# Patient Record
Sex: Male | Born: 1955 | Race: White | Hispanic: No | Marital: Married
Health system: Midwestern US, Academic
[De-identification: ages and names within clinical notes are randomized; demographics above are authoritative.]

---

## 2018-03-20 LAB — LIPID PROFILE
Lab: 156
Lab: 96

## 2018-03-20 LAB — COMPREHENSIVE METABOLIC PANEL: Lab: 142

## 2018-03-25 LAB — THYROID STIMULATING HORMONE-TSH: Lab: 1

## 2018-03-25 LAB — PROSTATIC SPECIFIC ANTIGEN-PSA

## 2018-04-02 ENCOUNTER — Encounter: Admit: 2018-04-02 | Discharge: 2018-04-02 | Payer: BC Managed Care – PPO

## 2018-04-02 DIAGNOSIS — E785 Hyperlipidemia, unspecified: ICD-10-CM

## 2018-04-02 DIAGNOSIS — I10 Essential (primary) hypertension: ICD-10-CM

## 2018-04-02 DIAGNOSIS — R970 Elevated carcinoembryonic antigen [CEA]: Principal | ICD-10-CM

## 2018-04-16 ENCOUNTER — Encounter: Admit: 2018-04-16 | Discharge: 2018-04-16 | Payer: BC Managed Care – PPO

## 2018-04-17 ENCOUNTER — Encounter: Admit: 2018-04-17 | Discharge: 2018-04-17 | Payer: BC Managed Care – PPO

## 2018-04-17 DIAGNOSIS — E669 Obesity, unspecified: ICD-10-CM

## 2018-04-17 DIAGNOSIS — I10 Essential (primary) hypertension: Principal | ICD-10-CM

## 2018-04-17 DIAGNOSIS — C61 Malignant neoplasm of prostate: ICD-10-CM

## 2018-04-17 DIAGNOSIS — N529 Male erectile dysfunction, unspecified: ICD-10-CM

## 2018-04-18 ENCOUNTER — Encounter: Admit: 2018-04-18 | Discharge: 2018-04-18 | Payer: BC Managed Care – PPO

## 2018-04-19 ENCOUNTER — Encounter: Admit: 2018-04-19 | Discharge: 2018-04-19 | Payer: BC Managed Care – PPO

## 2018-04-19 DIAGNOSIS — I10 Essential (primary) hypertension: Secondary | ICD-10-CM

## 2018-04-19 DIAGNOSIS — M549 Dorsalgia, unspecified: ICD-10-CM

## 2018-04-19 DIAGNOSIS — C61 Malignant neoplasm of prostate: ICD-10-CM

## 2018-04-19 DIAGNOSIS — N529 Male erectile dysfunction, unspecified: ICD-10-CM

## 2018-04-19 DIAGNOSIS — E669 Obesity, unspecified: ICD-10-CM

## 2018-04-19 DIAGNOSIS — E785 Hyperlipidemia, unspecified: ICD-10-CM

## 2018-04-25 ENCOUNTER — Ambulatory Visit: Admit: 2018-04-25 | Discharge: 2018-04-26 | Payer: BC Managed Care – PPO

## 2018-04-25 ENCOUNTER — Encounter: Admit: 2018-04-25 | Discharge: 2018-04-25 | Payer: BC Managed Care – PPO

## 2018-04-25 DIAGNOSIS — E785 Hyperlipidemia, unspecified: ICD-10-CM

## 2018-04-25 DIAGNOSIS — I10 Essential (primary) hypertension: ICD-10-CM

## 2018-04-25 DIAGNOSIS — N529 Male erectile dysfunction, unspecified: ICD-10-CM

## 2018-04-25 DIAGNOSIS — Z9189 Other specified personal risk factors, not elsewhere classified: ICD-10-CM

## 2018-04-25 DIAGNOSIS — M549 Dorsalgia, unspecified: ICD-10-CM

## 2018-04-25 DIAGNOSIS — E669 Obesity, unspecified: ICD-10-CM

## 2018-04-25 DIAGNOSIS — C61 Malignant neoplasm of prostate: ICD-10-CM

## 2018-04-25 DIAGNOSIS — E782 Mixed hyperlipidemia: ICD-10-CM

## 2018-04-25 DIAGNOSIS — R600 Localized edema: ICD-10-CM

## 2018-04-25 MED ORDER — METOPROLOL TARTRATE 25 MG PO TAB
25 mg | ORAL_TABLET | Freq: Two times a day (BID) | ORAL | 11 refills | 90.00000 days | Status: AC
Start: 2018-04-25 — End: 2018-05-30

## 2018-04-26 ENCOUNTER — Encounter: Admit: 2018-04-26 | Discharge: 2018-04-26 | Payer: BC Managed Care – PPO

## 2018-05-14 ENCOUNTER — Encounter: Admit: 2018-05-14 | Discharge: 2018-05-14 | Payer: BC Managed Care – PPO

## 2018-05-14 ENCOUNTER — Ambulatory Visit: Admit: 2018-05-14 | Discharge: 2018-05-15 | Payer: BC Managed Care – PPO

## 2018-05-14 ENCOUNTER — Ambulatory Visit: Admit: 2018-05-14 | Discharge: 2018-05-14 | Payer: BC Managed Care – PPO

## 2018-05-14 DIAGNOSIS — E785 Hyperlipidemia, unspecified: ICD-10-CM

## 2018-05-14 DIAGNOSIS — I10 Essential (primary) hypertension: Principal | ICD-10-CM

## 2018-05-14 DIAGNOSIS — R609 Edema, unspecified: ICD-10-CM

## 2018-05-14 DIAGNOSIS — I25119 Atherosclerotic heart disease of native coronary artery with unspecified angina pectoris: ICD-10-CM

## 2018-05-20 ENCOUNTER — Encounter: Admit: 2018-05-20 | Discharge: 2018-05-20 | Payer: BC Managed Care – PPO

## 2018-05-30 ENCOUNTER — Encounter: Admit: 2018-05-30 | Discharge: 2018-05-30 | Payer: BC Managed Care – PPO

## 2018-05-30 ENCOUNTER — Ambulatory Visit: Admit: 2018-05-30 | Discharge: 2018-05-31 | Payer: BC Managed Care – PPO

## 2018-05-30 DIAGNOSIS — R0600 Dyspnea, unspecified: Principal | ICD-10-CM

## 2018-05-30 DIAGNOSIS — R931 Abnormal findings on diagnostic imaging of heart and coronary circulation: ICD-10-CM

## 2018-05-30 DIAGNOSIS — I10 Essential (primary) hypertension: ICD-10-CM

## 2018-05-30 DIAGNOSIS — E785 Hyperlipidemia, unspecified: ICD-10-CM

## 2018-05-30 DIAGNOSIS — R9439 Abnormal result of other cardiovascular function study: ICD-10-CM

## 2018-05-30 DIAGNOSIS — N529 Male erectile dysfunction, unspecified: ICD-10-CM

## 2018-05-30 DIAGNOSIS — I5042 Chronic combined systolic (congestive) and diastolic (congestive) heart failure: ICD-10-CM

## 2018-05-30 DIAGNOSIS — E669 Obesity, unspecified: ICD-10-CM

## 2018-05-30 DIAGNOSIS — Z9189 Other specified personal risk factors, not elsewhere classified: ICD-10-CM

## 2018-05-30 DIAGNOSIS — M549 Dorsalgia, unspecified: ICD-10-CM

## 2018-05-30 DIAGNOSIS — C61 Malignant neoplasm of prostate: ICD-10-CM

## 2018-05-30 MED ORDER — METOPROLOL SUCCINATE 25 MG PO TB24
25 mg | ORAL_TABLET | Freq: Two times a day (BID) | ORAL | 3 refills | 90.00000 days | Status: AC
Start: 2018-05-30 — End: 2018-12-31

## 2018-05-30 MED ORDER — TEMAZEPAM 7.5 MG PO CAP
15 mg | Freq: Every evening | ORAL | 0 refills | Status: CN | PRN
Start: 2018-05-30 — End: ?

## 2018-05-30 MED ORDER — DOCUSATE SODIUM 100 MG PO CAP
100 mg | Freq: Every day | ORAL | 0 refills | Status: CN | PRN
Start: 2018-05-30 — End: ?

## 2018-05-30 MED ORDER — ASPIRIN 325 MG PO TAB
325 mg | Freq: Once | ORAL | 0 refills | Status: CN
Start: 2018-05-30 — End: ?

## 2018-05-30 MED ORDER — RAMIPRIL 1.25 MG PO CAP
1.25 mg | ORAL_CAPSULE | Freq: Every day | ORAL | 11 refills | Status: AC
Start: 2018-05-30 — End: 2018-07-23

## 2018-05-30 MED ORDER — ALUMINUM-MAGNESIUM HYDROXIDE 200-200 MG/5 ML PO SUSP
30 mL | ORAL | 0 refills | Status: CN | PRN
Start: 2018-05-30 — End: ?

## 2018-05-30 MED ORDER — NITROGLYCERIN 0.3 MG SL SUBL
.4 mg | SUBLINGUAL | 0 refills | Status: CN | PRN
Start: 2018-05-30 — End: ?

## 2018-05-31 ENCOUNTER — Encounter: Admit: 2018-05-31 | Discharge: 2018-05-31 | Payer: BC Managed Care – PPO

## 2018-05-31 DIAGNOSIS — E785 Hyperlipidemia, unspecified: Secondary | ICD-10-CM

## 2018-05-31 DIAGNOSIS — R9439 Abnormal result of other cardiovascular function study: Secondary | ICD-10-CM

## 2018-05-31 DIAGNOSIS — I10 Essential (primary) hypertension: ICD-10-CM

## 2018-05-31 DIAGNOSIS — R0600 Dyspnea, unspecified: Principal | ICD-10-CM

## 2018-05-31 LAB — CBC
Lab: 12
Lab: 31 — ABNORMAL LOW (ref 33–37)
Lab: 15
Lab: 212
Lab: 29
Lab: 48
Lab: 5.2
Lab: 8.8
Lab: 92

## 2018-05-31 LAB — BASIC METABOLIC PANEL
Lab: 104
Lab: 23
Lab: 4
Lab: 139
Lab: 23

## 2018-06-05 ENCOUNTER — Encounter: Admit: 2018-06-05 | Discharge: 2018-06-05 | Payer: BC Managed Care – PPO

## 2018-06-05 ENCOUNTER — Ambulatory Visit: Admit: 2018-06-05 | Discharge: 2018-06-05 | Payer: BC Managed Care – PPO

## 2018-06-05 DIAGNOSIS — I5042 Chronic combined systolic (congestive) and diastolic (congestive) heart failure: ICD-10-CM

## 2018-06-05 DIAGNOSIS — Z6836 Body mass index (BMI) 36.0-36.9, adult: ICD-10-CM

## 2018-06-05 DIAGNOSIS — E782 Mixed hyperlipidemia: ICD-10-CM

## 2018-06-05 DIAGNOSIS — I420 Dilated cardiomyopathy: Principal | ICD-10-CM

## 2018-06-05 DIAGNOSIS — I10 Essential (primary) hypertension: ICD-10-CM

## 2018-06-05 DIAGNOSIS — R931 Abnormal findings on diagnostic imaging of heart and coronary circulation: ICD-10-CM

## 2018-06-05 LAB — LIPID PROFILE
Lab: 124 mg/dL (ref <200)
Lab: 16 mg/dL
Lab: 45 mg/dL (ref >40)
Lab: 78 mg/dL (ref <150)
Lab: 79 mg/dL
Lab: 81 mg/dL (ref <100)

## 2018-06-05 MED ORDER — SODIUM CHLORIDE 0.9 % IV SOLP
1000 mL | INTRAVENOUS | 0 refills | Status: DC
Start: 2018-06-05 — End: 2018-06-05

## 2018-06-05 MED ORDER — DIPHENHYDRAMINE HCL 25 MG PO CAP
25 mg | ORAL | 0 refills | Status: DC | PRN
Start: 2018-06-05 — End: 2018-06-05

## 2018-06-05 MED ORDER — ACETAMINOPHEN 325 MG PO TAB
650 mg | ORAL | 0 refills | Status: DC | PRN
Start: 2018-06-05 — End: 2018-06-05
  Administered 2018-06-05: 15:00:00 650 mg via ORAL

## 2018-06-05 MED ORDER — ONDANSETRON HCL (PF) 4 MG/2 ML IJ SOLN
4 mg | INTRAVENOUS | 0 refills | Status: DC | PRN
Start: 2018-06-05 — End: 2018-06-05

## 2018-06-05 MED ORDER — TEMAZEPAM 15 MG PO CAP
15 mg | Freq: Every evening | ORAL | 0 refills | Status: DC | PRN
Start: 2018-06-05 — End: 2018-06-05

## 2018-06-05 MED ORDER — ASPIRIN 325 MG PO TAB
325 mg | Freq: Once | ORAL | 0 refills | Status: DC
Start: 2018-06-05 — End: 2018-06-05

## 2018-06-05 MED ORDER — NITROGLYCERIN 0.4 MG SL SUBL
.4 mg | SUBLINGUAL | 0 refills | Status: DC | PRN
Start: 2018-06-05 — End: 2018-06-05

## 2018-06-05 MED ORDER — FUROSEMIDE 40 MG PO TAB
40 mg | ORAL_TABLET | Freq: Every morning | ORAL | 3 refills | 90.00000 days | Status: AC
Start: 2018-06-05 — End: 2018-07-29

## 2018-06-05 MED ORDER — DIPHENHYDRAMINE HCL 50 MG/ML IJ SOLN
25 mg | INTRAVENOUS | 0 refills | Status: DC | PRN
Start: 2018-06-05 — End: 2018-06-05

## 2018-06-05 MED ORDER — POTASSIUM CHLORIDE 10 MEQ PO TBER
10 meq | ORAL_TABLET | Freq: Every day | ORAL | 3 refills | 30.00000 days | Status: AC
Start: 2018-06-05 — End: 2018-08-28

## 2018-06-05 MED ORDER — DOCUSATE SODIUM 100 MG PO CAP
100 mg | Freq: Every day | ORAL | 0 refills | Status: DC | PRN
Start: 2018-06-05 — End: 2018-06-05

## 2018-06-05 MED ORDER — ASPIRIN 81 MG PO TBEC
81 mg | ORAL_TABLET | Freq: Every day | ORAL | 0 refills | Status: AC
Start: 2018-06-05 — End: ?

## 2018-06-05 MED ORDER — ALUMINUM-MAGNESIUM HYDROXIDE 200-200 MG/5 ML PO SUSP
30 mL | ORAL | 0 refills | Status: DC | PRN
Start: 2018-06-05 — End: 2018-06-05

## 2018-06-11 ENCOUNTER — Encounter: Admit: 2018-06-11 | Discharge: 2018-06-11 | Payer: BC Managed Care – PPO

## 2018-06-11 DIAGNOSIS — I10 Essential (primary) hypertension: ICD-10-CM

## 2018-06-11 DIAGNOSIS — E782 Mixed hyperlipidemia: ICD-10-CM

## 2018-06-11 DIAGNOSIS — R9439 Abnormal result of other cardiovascular function study: Principal | ICD-10-CM

## 2018-06-11 DIAGNOSIS — I5042 Chronic combined systolic (congestive) and diastolic (congestive) heart failure: ICD-10-CM

## 2018-06-11 LAB — BASIC METABOLIC PANEL
Lab: 1.1 mg/dL (ref 0.3–1.2)
Lab: 107 mg/dL (ref 0.4–1.24)
Lab: 114 g/dL (ref 3.5–5.0)
Lab: 138 mg/dL — ABNORMAL HIGH (ref 70–100)
Lab: 19 g/dL (ref 6.0–8.0)
Lab: 23 mg/dL (ref 8.5–10.6)
Lab: 4.3 mg/dL (ref 7–25)
Lab: 68 U/L (ref 7–40)
Lab: 8.8 U/L — ABNORMAL LOW (ref 25–110)

## 2018-06-21 ENCOUNTER — Encounter: Admit: 2018-06-21 | Discharge: 2018-06-21 | Payer: BC Managed Care – PPO

## 2018-07-02 ENCOUNTER — Encounter: Admit: 2018-07-02 | Discharge: 2018-07-02 | Payer: BC Managed Care – PPO

## 2018-07-02 ENCOUNTER — Ambulatory Visit: Admit: 2018-07-02 | Discharge: 2018-07-03 | Payer: BC Managed Care – PPO

## 2018-07-02 DIAGNOSIS — I5042 Chronic combined systolic (congestive) and diastolic (congestive) heart failure: ICD-10-CM

## 2018-07-02 DIAGNOSIS — R931 Abnormal findings on diagnostic imaging of heart and coronary circulation: ICD-10-CM

## 2018-07-02 DIAGNOSIS — Z9889 Other specified postprocedural states: ICD-10-CM

## 2018-07-02 DIAGNOSIS — I10 Essential (primary) hypertension: Principal | ICD-10-CM

## 2018-07-02 DIAGNOSIS — E785 Hyperlipidemia, unspecified: ICD-10-CM

## 2018-07-02 DIAGNOSIS — E782 Mixed hyperlipidemia: ICD-10-CM

## 2018-07-02 DIAGNOSIS — M549 Dorsalgia, unspecified: ICD-10-CM

## 2018-07-02 DIAGNOSIS — R600 Localized edema: ICD-10-CM

## 2018-07-02 DIAGNOSIS — N529 Male erectile dysfunction, unspecified: ICD-10-CM

## 2018-07-02 DIAGNOSIS — C61 Malignant neoplasm of prostate: ICD-10-CM

## 2018-07-02 DIAGNOSIS — E669 Obesity, unspecified: ICD-10-CM

## 2018-07-02 MED ORDER — SPIRONOLACTONE 25 MG PO TAB
12.5 mg | ORAL_TABLET | Freq: Every day | ORAL | 3 refills | 46.00000 days | Status: AC
Start: 2018-07-02 — End: 2018-12-31

## 2018-07-09 LAB — BASIC METABOLIC PANEL
Lab: 1.2
Lab: 103
Lab: 12
Lab: 120 — ABNORMAL HIGH (ref 70–105)
Lab: 139
Lab: 22
Lab: 29
Lab: 4.7
Lab: 9.2

## 2018-07-11 ENCOUNTER — Encounter: Admit: 2018-07-11 | Discharge: 2018-07-11 | Payer: BC Managed Care – PPO

## 2018-07-11 ENCOUNTER — Ambulatory Visit: Admit: 2018-07-11 | Discharge: 2018-07-12 | Payer: BC Managed Care – PPO

## 2018-07-11 DIAGNOSIS — R931 Abnormal findings on diagnostic imaging of heart and coronary circulation: ICD-10-CM

## 2018-07-11 DIAGNOSIS — I10 Essential (primary) hypertension: Principal | ICD-10-CM

## 2018-07-11 DIAGNOSIS — I5042 Chronic combined systolic (congestive) and diastolic (congestive) heart failure: ICD-10-CM

## 2018-07-23 ENCOUNTER — Encounter: Admit: 2018-07-23 | Discharge: 2018-07-23 | Payer: BC Managed Care – PPO

## 2018-07-23 MED ORDER — RAMIPRIL 1.25 MG PO CAP
ORAL_CAPSULE | Freq: Every day | ORAL | 4 refills | Status: AC
Start: 2018-07-23 — End: 2018-09-06

## 2018-07-27 ENCOUNTER — Encounter: Admit: 2018-07-27 | Discharge: 2018-07-27 | Payer: BC Managed Care – PPO

## 2018-07-29 MED ORDER — FUROSEMIDE 40 MG PO TAB
ORAL_TABLET | Freq: Every day | ORAL | 3 refills | 90.00000 days | Status: AC
Start: 2018-07-29 — End: 2018-12-31

## 2018-08-06 ENCOUNTER — Encounter: Admit: 2018-08-06 | Discharge: 2018-08-06 | Payer: BC Managed Care – PPO

## 2018-08-06 DIAGNOSIS — I10 Essential (primary) hypertension: Secondary | ICD-10-CM

## 2018-08-28 ENCOUNTER — Encounter: Admit: 2018-08-28 | Discharge: 2018-08-28 | Payer: BC Managed Care – PPO

## 2018-08-28 MED ORDER — POTASSIUM CHLORIDE 10 MEQ PO TBER
10 meq | ORAL_TABLET | Freq: Every day | ORAL | 3 refills | 30.00000 days | Status: AC
Start: 2018-08-28 — End: 2018-12-31

## 2018-08-30 ENCOUNTER — Encounter: Admit: 2018-08-30 | Discharge: 2018-08-30 | Payer: BC Managed Care – PPO

## 2018-08-30 DIAGNOSIS — I10 Essential (primary) hypertension: Principal | ICD-10-CM

## 2018-08-30 LAB — COMPREHENSIVE METABOLIC PANEL
Lab: 0.7
Lab: 1.2
Lab: 11
Lab: 123 — ABNORMAL HIGH (ref 70–105)
Lab: 25
Lab: 3.4
Lab: 79
Lab: 110 — ABNORMAL HIGH (ref 98–107)
Lab: 141
Lab: 20
Lab: 22
Lab: 22
Lab: 4.6
Lab: 6.6
Lab: 8.8

## 2018-09-06 ENCOUNTER — Ambulatory Visit: Admit: 2018-09-06 | Discharge: 2018-09-07 | Payer: BC Managed Care – PPO

## 2018-09-06 ENCOUNTER — Encounter: Admit: 2018-09-06 | Discharge: 2018-09-06 | Payer: BC Managed Care – PPO

## 2018-09-06 ENCOUNTER — Ambulatory Visit: Admit: 2018-09-06 | Discharge: 2018-09-06 | Payer: BC Managed Care – PPO

## 2018-09-06 DIAGNOSIS — Z9889 Other specified postprocedural states: ICD-10-CM

## 2018-09-06 DIAGNOSIS — I10 Essential (primary) hypertension: Principal | ICD-10-CM

## 2018-09-06 DIAGNOSIS — R931 Abnormal findings on diagnostic imaging of heart and coronary circulation: ICD-10-CM

## 2018-09-06 DIAGNOSIS — I5042 Chronic combined systolic (congestive) and diastolic (congestive) heart failure: Principal | ICD-10-CM

## 2018-09-06 DIAGNOSIS — Z9189 Other specified personal risk factors, not elsewhere classified: ICD-10-CM

## 2018-09-06 DIAGNOSIS — M549 Dorsalgia, unspecified: ICD-10-CM

## 2018-09-06 DIAGNOSIS — C61 Malignant neoplasm of prostate: ICD-10-CM

## 2018-09-06 DIAGNOSIS — N529 Male erectile dysfunction, unspecified: ICD-10-CM

## 2018-09-06 DIAGNOSIS — E669 Obesity, unspecified: ICD-10-CM

## 2018-09-06 DIAGNOSIS — Z789 Other specified health status: ICD-10-CM

## 2018-09-06 DIAGNOSIS — E782 Mixed hyperlipidemia: ICD-10-CM

## 2018-09-06 DIAGNOSIS — R9439 Abnormal result of other cardiovascular function study: ICD-10-CM

## 2018-09-06 DIAGNOSIS — E785 Hyperlipidemia, unspecified: Secondary | ICD-10-CM

## 2018-09-06 MED ORDER — RAMIPRIL 2.5 MG PO CAP
2.5 mg | Freq: Two times a day (BID) | ORAL | 0 refills | Status: AC
Start: 2018-09-06 — End: 2018-10-14

## 2018-10-08 ENCOUNTER — Encounter: Admit: 2018-10-08 | Discharge: 2018-10-08 | Payer: BC Managed Care – PPO

## 2018-10-08 LAB — COMPREHENSIVE METABOLIC PANEL
Lab: 14
Lab: 59

## 2018-10-14 ENCOUNTER — Encounter: Admit: 2018-10-14 | Discharge: 2018-10-14 | Payer: BC Managed Care – PPO

## 2018-10-14 MED ORDER — RAMIPRIL 2.5 MG PO CAP
2.5 mg | ORAL_CAPSULE | Freq: Two times a day (BID) | ORAL | 1 refills | Status: AC
Start: 2018-10-14 — End: 2018-12-31

## 2018-10-14 NOTE — Telephone Encounter
10/14/2018 2:04 PM   Patient called to request refill for ramipril, refilled as requested.  Colvin Caroli, LPN

## 2018-12-30 ENCOUNTER — Encounter: Admit: 2018-12-30 | Discharge: 2018-12-30

## 2018-12-30 NOTE — Telephone Encounter
12/30/18 Spoke with pt to make sure he hasn't seen his pcp or any other physician/facilty since he last was seen by caridolgy, He stated no he hasn't seen his pcp ( hasn't seen them since 9/19) or been seen anywhere.   No records to request   Edh

## 2018-12-31 ENCOUNTER — Encounter: Admit: 2018-12-31 | Discharge: 2018-12-31

## 2018-12-31 ENCOUNTER — Ambulatory Visit: Admit: 2018-12-31 | Discharge: 2019-01-01

## 2018-12-31 DIAGNOSIS — I10 Essential (primary) hypertension: Secondary | ICD-10-CM

## 2018-12-31 DIAGNOSIS — N529 Male erectile dysfunction, unspecified: Secondary | ICD-10-CM

## 2018-12-31 DIAGNOSIS — E669 Obesity, unspecified: Secondary | ICD-10-CM

## 2018-12-31 DIAGNOSIS — Z9889 Other specified postprocedural states: Secondary | ICD-10-CM

## 2018-12-31 DIAGNOSIS — M549 Dorsalgia, unspecified: Secondary | ICD-10-CM

## 2018-12-31 DIAGNOSIS — I5042 Chronic combined systolic (congestive) and diastolic (congestive) heart failure: Secondary | ICD-10-CM

## 2018-12-31 DIAGNOSIS — C61 Malignant neoplasm of prostate: Secondary | ICD-10-CM

## 2018-12-31 DIAGNOSIS — E785 Hyperlipidemia, unspecified: Secondary | ICD-10-CM

## 2018-12-31 DIAGNOSIS — E782 Mixed hyperlipidemia: Secondary | ICD-10-CM

## 2018-12-31 DIAGNOSIS — Z789 Other specified health status: Secondary | ICD-10-CM

## 2018-12-31 MED ORDER — METOPROLOL SUCCINATE 25 MG PO TB24
25 mg | ORAL_TABLET | Freq: Two times a day (BID) | ORAL | 3 refills | 90.00000 days | Status: DC
Start: 2018-12-31 — End: 2019-03-14

## 2018-12-31 MED ORDER — POTASSIUM CHLORIDE 10 MEQ PO TBER
10 meq | ORAL_TABLET | Freq: Every day | ORAL | 3 refills | 30.00000 days | Status: DC
Start: 2018-12-31 — End: 2019-08-14

## 2018-12-31 MED ORDER — SPIRONOLACTONE 25 MG PO TAB
12.5 mg | ORAL_TABLET | Freq: Every day | ORAL | 3 refills | 90.00000 days | Status: DC
Start: 2018-12-31 — End: 2019-09-18

## 2018-12-31 MED ORDER — FUROSEMIDE 40 MG PO TAB
40 mg | ORAL_TABLET | Freq: Every morning | ORAL | 3 refills | 90.00000 days | Status: DC
Start: 2018-12-31 — End: 2019-07-04

## 2018-12-31 MED ORDER — ATORVASTATIN 10 MG PO TAB
10 mg | ORAL_TABLET | Freq: Every day | ORAL | 3 refills | Status: AC
Start: 2018-12-31 — End: ?

## 2018-12-31 MED ORDER — RAMIPRIL 2.5 MG PO CAP
2.5 mg | ORAL_CAPSULE | Freq: Two times a day (BID) | ORAL | 3 refills | Status: DC
Start: 2018-12-31 — End: 2019-04-08

## 2018-12-31 NOTE — Progress Notes
Date of Service: 12/31/2018    Alexander Harrison is a 63 y.o. male.       HPI     Patient is a 63 year old male that has a history of cardiomyopathy, an echocardiogram was performed and it was 30% by an echocardiogram dated 05/14/2018.  Patient was evaluated with a left heart catheterization, he was not found to have any obstructive CAD.  He was started on goal-directed heart failure medical therapy.  The most recent echocardiogram performed in February 2020 demonstrated normal left ventricular systolic function.    Patient's blood pressure is borderline elevated today.    He has not obtained much success in losing weight, but he did not gain any significant amount of weight.    He has not been experiencing symptoms of chest pain, he does have occasional shortness of breath.         Vitals:    12/31/18 1351 12/31/18 1352   BP: (!) 144/84 136/84   BP Source: Arm, Left Upper Arm, Right Upper   Pulse: 90    SpO2: 98%    Weight: 107.1 kg (236 lb 3.2 oz)    Height: 1.727 m (5' 8)    PainSc: Zero      Body mass index is 35.91 kg/m???.     Past Medical History  Patient Active Problem List    Diagnosis Date Noted   ??? Weight loss advised 09/06/2018   ??? History of left heart catheterization 07/02/2018   ??? Abnormal thallium stress test 06/05/2018   ??? Combined systolic and diastolic heart failure (HCC) 05/30/2018   ??? Abnormal echocardiogram 05/30/2018   ??? Abnormal thallium stress test 05/30/2018   ??? Bilateral leg edema 04/25/2018   ??? At high risk for coronary artery disease 04/25/2018   ??? Class 2 severe obesity due to excess calories with serious comorbidity and body mass index (BMI) of 36.0 to 36.9 in adult [facility]) 04/25/2018   ??? Hyperlipidemia 04/25/2018   ??? Back pain, chronic 04/19/2018   ??? Essential hypertension 04/19/2018   ??? Dyslipidemia 04/19/2018   ??? 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   PSA = 7.25 ng/mL. CT Scan & Bone Scan --> negative for mets.  RALP, Non-Nerve Sparing -- 09/05/2010  pT2c N0 Mx, Gleason 4+5=9, Margins Neg           Review of Systems   Constitution: Negative.   HENT: Positive for congestion, hoarse voice, stridor and tinnitus.    Eyes: Negative.    Cardiovascular: Positive for chest pain and dyspnea on exertion.   Respiratory: Positive for shortness of breath and wheezing.    Endocrine: Negative.    Hematologic/Lymphatic: Negative.    Skin: Positive for skin cancer.   Musculoskeletal: Negative.    Gastrointestinal: Negative.    Genitourinary: Negative.    Neurological: Positive for dizziness.   Psychiatric/Behavioral: Negative.    Allergic/Immunologic: Positive for environmental allergies.       Physical Exam  General Appearance: obese  Skin: warm, moist, no ulcers or xanthomas  Eyes: conjunctivae and lids normal, pupils are equal and round  Lips & Oral Mucosa: no pallor or cyanosis  Neck Veins: neck veins are flat, neck veins are not distended  Chest Inspection: chest is normal in appearance  Respiratory Effort: breathing comfortably, no respiratory distress  Auscultation/Percussion: lungs clear to auscultation, no rales or rhonchi, no wheezing  Cardiac Rhythm: regular rhythm and normal  rate  Cardiac Auscultation: S1, S2 normal, no rub, no gallop  Murmurs: no murmur  Carotid Arteries: normal carotid upstroke bilaterally, no bruit  Abdominal aorta: could not be examined due to obese adomen  Lower Extremity Edema: no lower extremity edema  Abdominal Exam: soft, non-tender, no masses, bowel sounds normal  Liver & Spleen: no organomegaly  Language and Memory: patient responsive and seems to comprehend information  Neurologic Exam: neurological assessment grossly intact        Cardiovascular Studies      Problems Addressed Today  Encounter Diagnoses   Name Primary?   ??? Mixed hyperlipidemia Yes   ??? Essential hypertension    ??? Chronic combined systolic and diastolic heart failure (HCC) ??? Weight loss advised    ??? Prostate cancer (HCC)    ??? History of left heart catheterization        Assessment and Plan     In summary: This is a 63 year old white male with a history of nonischemic cardiomyopathy and an ejection fraction of 30% by an echocardiogram performed in October 2019, patient did undergo a left heart catheterization on 06/05/2018, he was not found to have any obstructive CAD, he was started on appropriate goal-directed.  Medical therapy for heart failure, the most recent echocardiogram dated 09/06/2018 did demonstrate normal systolic function with an EF of 55%.    Plan:    1.  We did discuss about continuing risk factors modification, weight reduction, low-fat, low-cholesterol, low sugar, low carbohydrate and low-salt diet.  2.  I did advise the patient on weight reduction  3.  Follow-up office visit in 6 months         Current Medications (including today's revisions)  ??? acetaminophen (TYLENOL) 500 mg tablet Take 1,000 mg by mouth as Needed for Pain. Max of 4,000 mg of acetaminophen in 24 hours.   ??? albuterol (PROAIR HFA, VENTOLIN HFA, OR PROVENTIL HFA) 90 mcg/actuation inhaler Inhale 2 puffs by mouth into the lungs every 6 hours as needed for Wheezing or Shortness of Breath. Shake well before use.   ??? aspirin EC 81 mg tablet Take one tablet by mouth daily. Take with food.   ??? atorvastatin (LIPITOR) 10 mg tablet Take 10 mg by mouth daily.   ??? fexofenadine-pseudoephedrine (ALLEGRA-D 24) 180-240 mg tablet Take 1 tablet by mouth daily as needed.   ??? fluticasone propionate (FLONASE) 50 mcg/actuation nasal spray Apply 2 sprays to each nostril as directed daily as needed. Shake bottle gently before using.   ??? fluticasone propionate (FLOVENT HFA) 110 mcg/actuation inhaler Inhale 1 puff by mouth into the lungs twice daily.   ??? furosemide (LASIX) 40 mg tablet TAKE 1 TABLET BY MOUTH EVERY DAY IN THE MORNING   ??? metoprolol XL (TOPROL XL) 25 mg extended release tablet Take one tablet by mouth twice daily.   ??? potassium chloride (K-DUR) 10 mEq tablet TAKE ONE TABLET BY MOUTH DAILY. TAKE WITH A MEAL AND A FULL GLASS OF WATER.   ??? ramipriL (ALTACE) 2.5 mg capsule Take one capsule by mouth twice daily.   ??? spironolactone (ALDACTONE) 25 mg tablet Take one-half tablet by mouth daily. Take with food.

## 2019-03-14 ENCOUNTER — Encounter: Admit: 2019-03-14 | Discharge: 2019-03-14

## 2019-03-14 MED ORDER — METOPROLOL SUCCINATE 25 MG PO TB24
25 mg | ORAL_TABLET | Freq: Two times a day (BID) | ORAL | 2 refills | 90.00000 days | Status: DC
Start: 2019-03-14 — End: 2019-10-15

## 2019-04-08 ENCOUNTER — Encounter: Admit: 2019-04-08 | Discharge: 2019-04-08 | Payer: BC Managed Care – PPO

## 2019-04-08 MED ORDER — RAMIPRIL 2.5 MG PO CAP
ORAL_CAPSULE | Freq: Two times a day (BID) | ORAL | 3 refills | Status: AC
Start: 2019-04-08 — End: ?

## 2019-07-04 ENCOUNTER — Encounter: Admit: 2019-07-04 | Discharge: 2019-07-04 | Payer: BC Managed Care – PPO

## 2019-07-04 MED ORDER — FUROSEMIDE 40 MG PO TAB
40 mg | ORAL_TABLET | Freq: Every morning | ORAL | 3 refills | 90.00000 days | Status: AC
Start: 2019-07-04 — End: ?

## 2019-08-14 ENCOUNTER — Encounter: Admit: 2019-08-14 | Discharge: 2019-08-14 | Payer: BC Managed Care – PPO

## 2019-08-14 MED ORDER — POTASSIUM CHLORIDE 10 MEQ PO TBER
10 meq | ORAL_TABLET | Freq: Every day | ORAL | 3 refills | 30.00000 days | Status: AC
Start: 2019-08-14 — End: ?

## 2019-09-18 ENCOUNTER — Encounter: Admit: 2019-09-18 | Discharge: 2019-09-18 | Payer: BC Managed Care – PPO

## 2019-09-18 MED ORDER — SPIRONOLACTONE 25 MG PO TAB
12.5 mg | ORAL_TABLET | Freq: Every day | ORAL | 3 refills | 90.00000 days | Status: AC
Start: 2019-09-18 — End: ?

## 2019-10-15 ENCOUNTER — Encounter: Admit: 2019-10-15 | Discharge: 2019-10-15 | Payer: BC Managed Care – PPO

## 2019-10-15 MED ORDER — METOPROLOL SUCCINATE 25 MG PO TB24
ORAL_TABLET | Freq: Two times a day (BID) | ORAL | 3 refills | 90.00000 days | Status: AC
Start: 2019-10-15 — End: ?

## 2020-03-28 ENCOUNTER — Encounter: Admit: 2020-03-28 | Discharge: 2020-03-28 | Payer: BC Managed Care – PPO

## 2020-03-28 MED ORDER — RAMIPRIL 2.5 MG PO CAP
ORAL_CAPSULE | Freq: Two times a day (BID) | 3 refills
Start: 2020-03-28 — End: ?

## 2020-04-19 ENCOUNTER — Encounter: Admit: 2020-04-19 | Discharge: 2020-04-19 | Payer: BC Managed Care – PPO

## 2020-04-19 NOTE — Telephone Encounter
Received a call from Sarah at Dr. Kermit Balo office [phone] requesting permission to hold Aspirin 81mg  for 5-7 days prior to skin cancer excision on 10/26.      Will discuss with [facility] on 10/11 when she is in clinic and will call with her recommendations.

## 2020-04-24 IMAGING — RF FL guided spine inject
1 series · 6 of 6 positions shown · non-contrast
Comparison: none

[Series 1: run · 4 acquisitions, 6 frames shown]
[im 1/4]
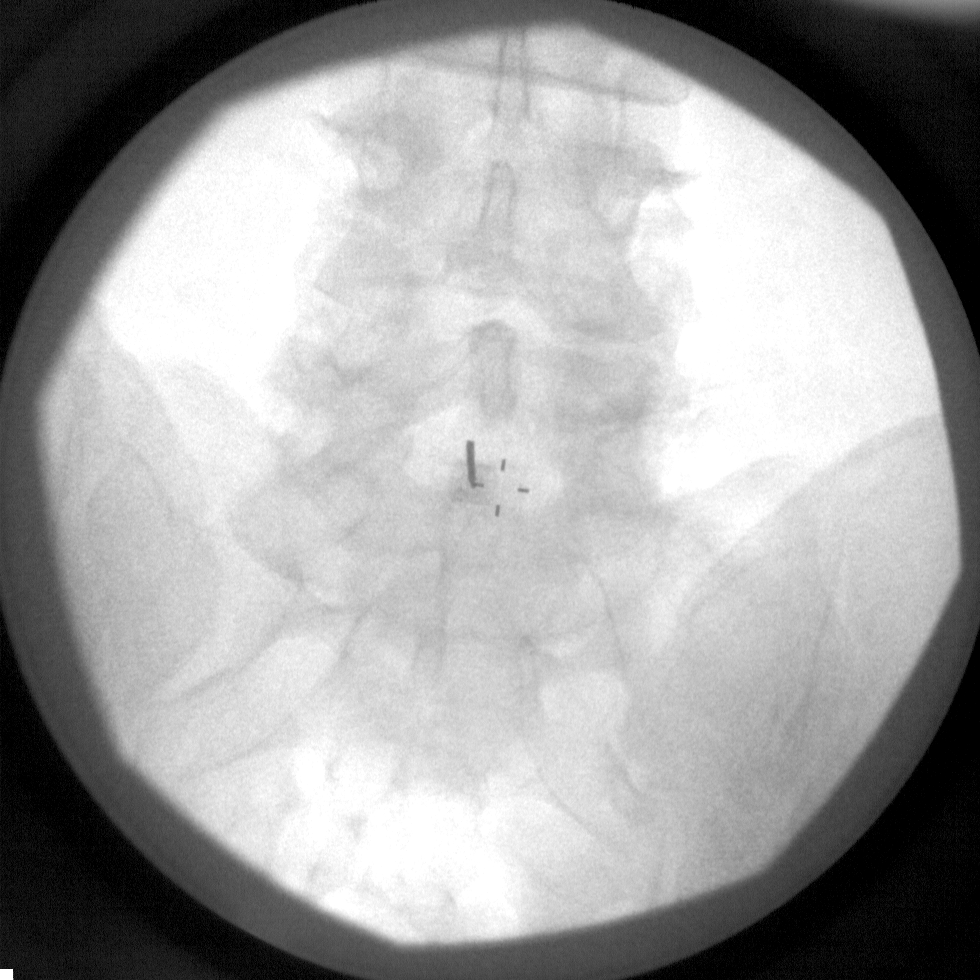
[im 2/4]
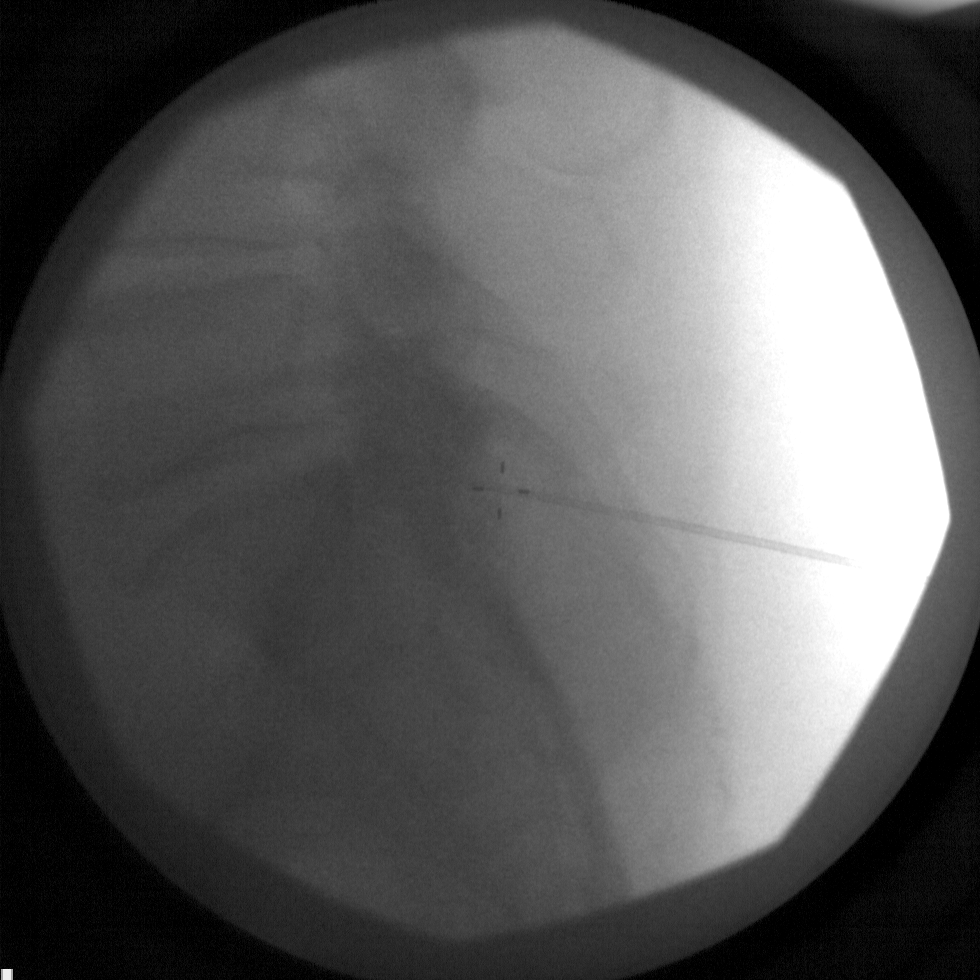
[im 3/4]
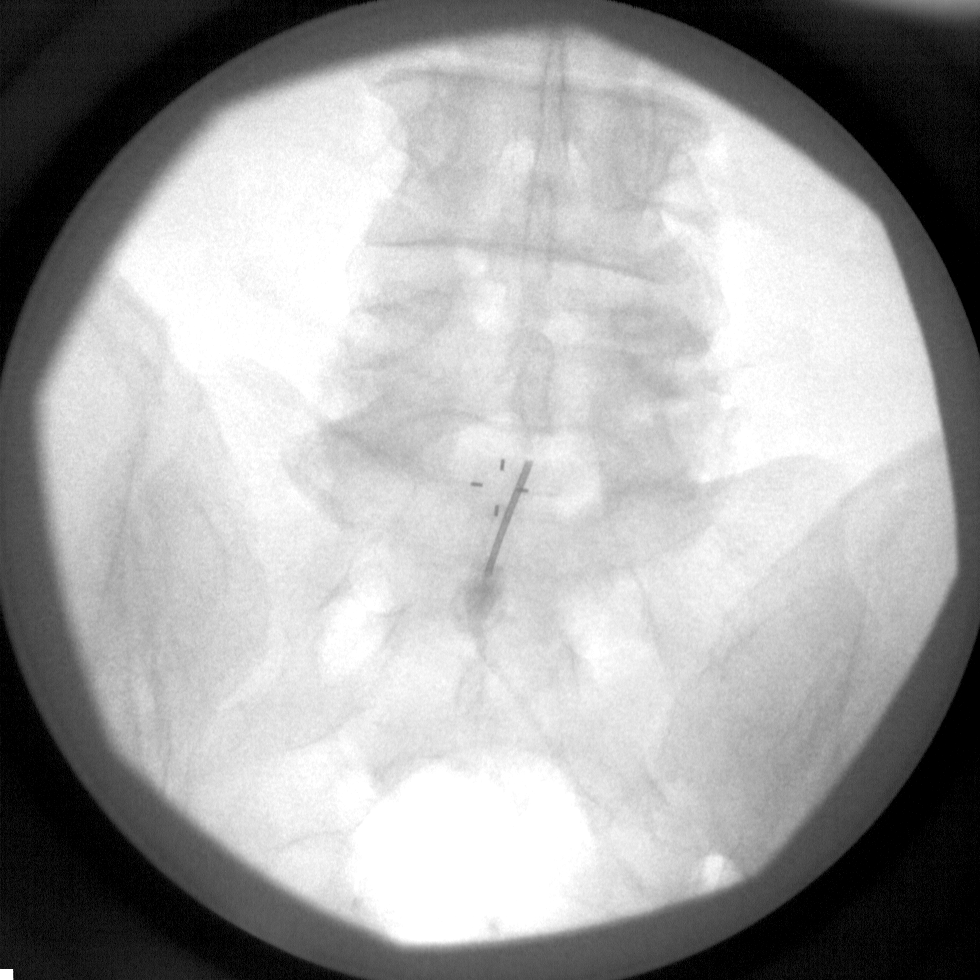
[im 4/4]
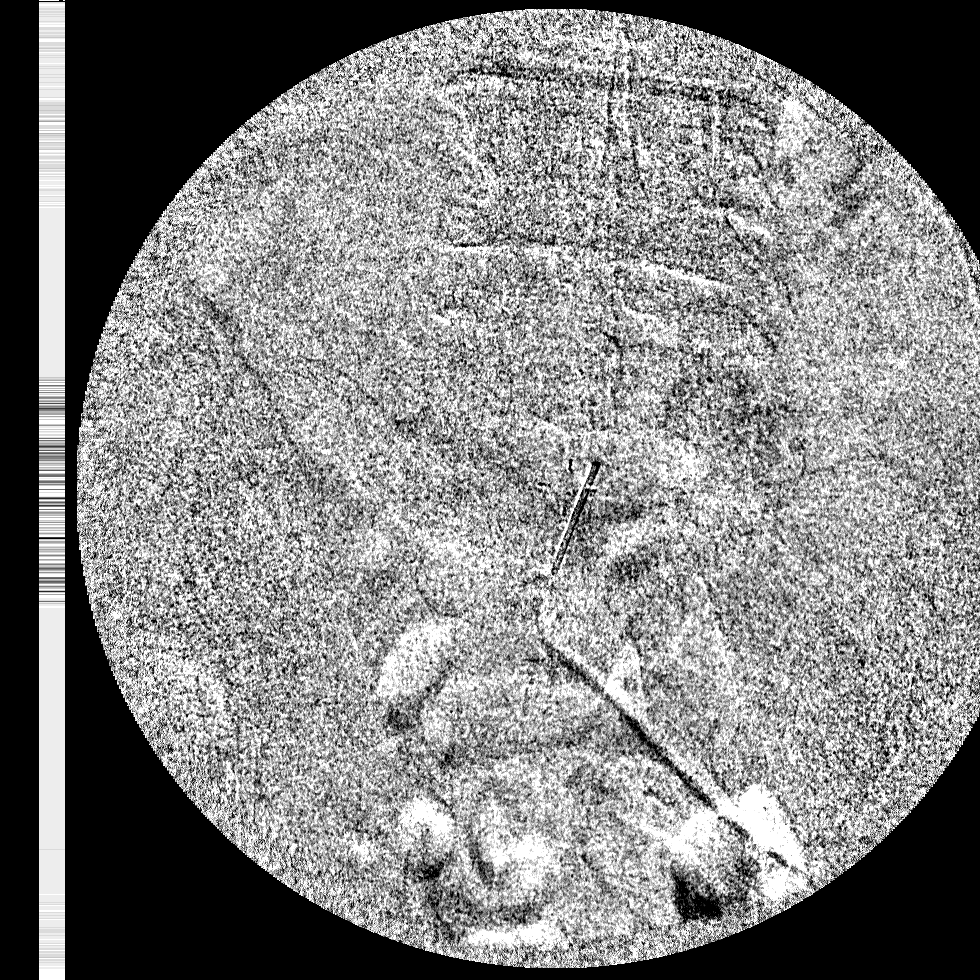
[im 4/4]
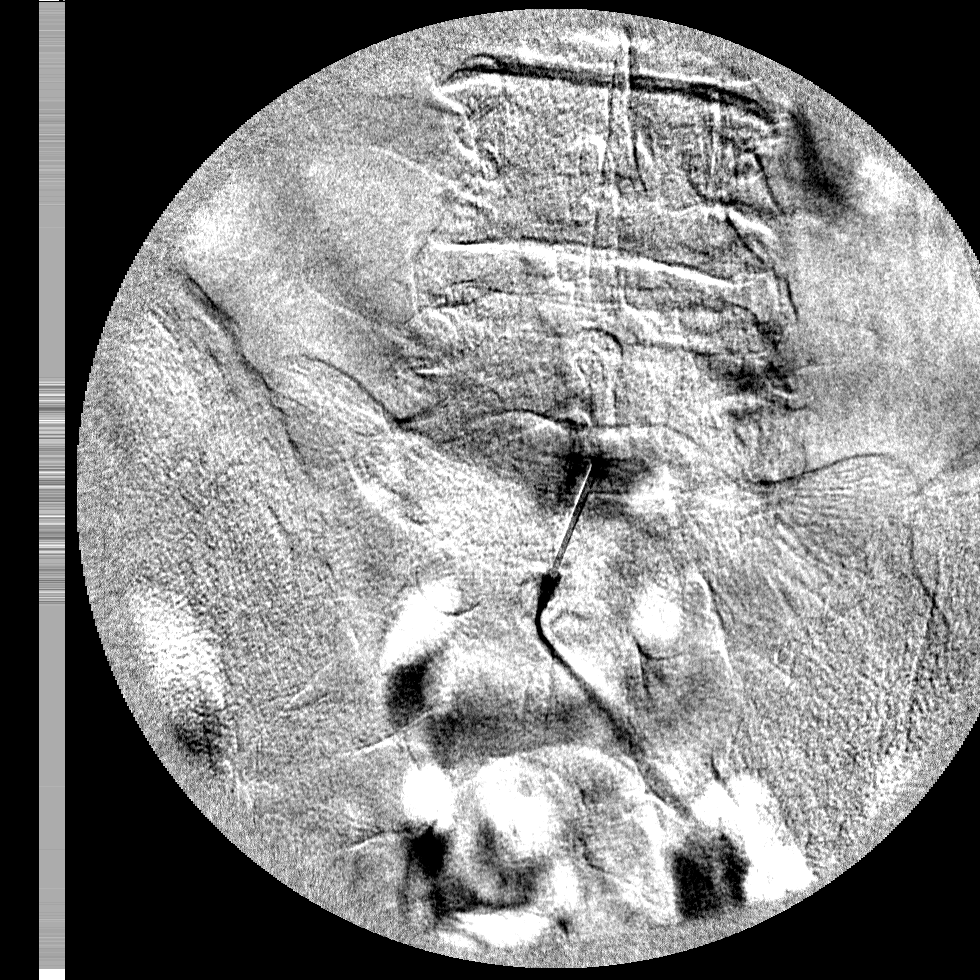
[im 4/4]
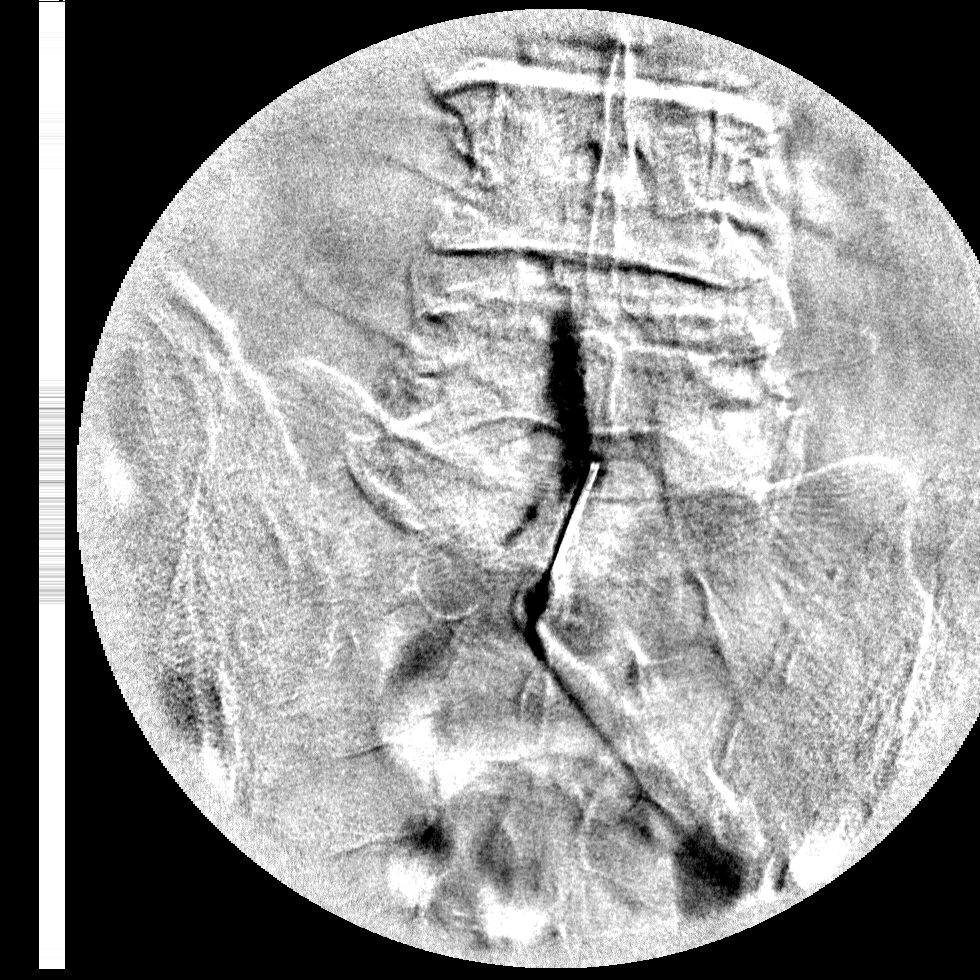

[6 of 6 positions shown; findings below may reference images not displayed]

EXAM

Fluoroscopic assistance.

INDICATION

REBEL
REBEL. Fluoro time 22.9 sec, 43 images

FINDINGS

Fluoroscopic assistance for positioning of needle at the lumbosacral junction region. Total
fluoroscopy time 23 seconds. A total of 43 images were saved. Refer to procedure notes for detailed
findings.

IMPRESSION

Fluoroscopic assistance for needle positioning.

Tech Notes:

REBEL.

## 2020-06-20 ENCOUNTER — Encounter: Admit: 2020-06-20 | Discharge: 2020-06-20 | Payer: BC Managed Care – PPO

## 2020-06-20 MED ORDER — RAMIPRIL 2.5 MG PO CAP
ORAL_CAPSULE | Freq: Two times a day (BID) | 0 refills
Start: 2020-06-20 — End: ?

## 2020-06-23 ENCOUNTER — Encounter: Admit: 2020-06-23 | Discharge: 2020-06-23

## 2020-06-23 MED ORDER — FUROSEMIDE 40 MG PO TAB
ORAL_TABLET | Freq: Every day | ORAL | 0 refills | 90.00000 days | Status: AC
Start: 2020-06-23 — End: ?

## 2020-06-30 ENCOUNTER — Encounter: Admit: 2020-06-30 | Discharge: 2020-06-30 | Payer: BC Managed Care – PPO

## 2020-07-05 ENCOUNTER — Ambulatory Visit: Admit: 2020-07-05 | Discharge: 2020-07-05 | Payer: BC Managed Care – PPO

## 2020-07-05 ENCOUNTER — Encounter: Admit: 2020-07-05 | Discharge: 2020-07-05 | Payer: BC Managed Care – PPO

## 2020-07-05 DIAGNOSIS — E669 Obesity, unspecified: Secondary | ICD-10-CM

## 2020-07-05 DIAGNOSIS — N529 Male erectile dysfunction, unspecified: Secondary | ICD-10-CM

## 2020-07-05 DIAGNOSIS — M549 Dorsalgia, unspecified: Secondary | ICD-10-CM

## 2020-07-05 DIAGNOSIS — M5136 Other intervertebral disc degeneration, lumbar region: Secondary | ICD-10-CM

## 2020-07-05 DIAGNOSIS — M5416 Radiculopathy, lumbar region: Secondary | ICD-10-CM

## 2020-07-05 DIAGNOSIS — M2550 Pain in unspecified joint: Secondary | ICD-10-CM

## 2020-07-05 DIAGNOSIS — M48062 Spinal stenosis, lumbar region with neurogenic claudication: Secondary | ICD-10-CM

## 2020-07-05 DIAGNOSIS — C801 Malignant (primary) neoplasm, unspecified: Secondary | ICD-10-CM

## 2020-07-05 DIAGNOSIS — I10 Essential (primary) hypertension: Secondary | ICD-10-CM

## 2020-07-05 DIAGNOSIS — E785 Hyperlipidemia, unspecified: Secondary | ICD-10-CM

## 2020-07-05 DIAGNOSIS — C61 Malignant neoplasm of prostate: Secondary | ICD-10-CM

## 2020-07-05 DIAGNOSIS — M7918 Myalgia, other site: Secondary | ICD-10-CM

## 2020-07-05 MED ORDER — TIZANIDINE 4 MG PO TAB
4 mg | ORAL_TABLET | Freq: Every evening | ORAL | 1 refills | Status: AC | PRN
Start: 2020-07-05 — End: ?

## 2020-07-05 MED ORDER — PREGABALIN 50 MG PO CAP
50 mg | ORAL_CAPSULE | Freq: Three times a day (TID) | ORAL | 1 refills | Status: AC
Start: 2020-07-05 — End: ?

## 2020-07-05 NOTE — Progress Notes
[facility] NEW PATIENT CLINIC NOTE       Dear Dr. Jackquline Berlin,     I appreciate your kind referral of Alexander Harrison for evaluation of pain. Please see my note below for the full details of the evaluation and management plan.     Thank you,     Elvia Collum, MD    CC:    Chief Complaint   Patient presents with   ? Lower Back - Pain   ? Follow Up        Referring Provider: Jackquline Berlin     HPI: Alexander Harrison is a 64 y.o. male who presents for evaluation of low back and right leg pain.  He is a patient of Dr. Aundria Rud and was seen on 06/21/20.  There was concerns that his symptoms may be from spinal stenosis and he was referred here today for further evaluation.  He has a history of lumbar disc herniation and had received a two level microdisectomy (L4-5).  He also has significant CAD and had a previous decreased LVEF which improved after starting therapy for his CAD.  He states he has been having low back and leg pain for over 15-20 years.  He states that when it started in the past it affected his left leg.  Recently he had acute on chronic exacerbation 4 weeks ago where he had pain in his back and what he describes cramping in the muscles down the right leg.  He states he has been using ibuprofen and tylenol, ice, heat, and different salves with only minimal relief.  He denies any numbness and tingling like how he had in the past.  He states recently, he notes he cannot walk more than 5-10 feet without pain.  When he sits down, the pain improves some.  He otherwise denies any cardiac CP, SOB, or abdominal pain.    Inciting event:  None that he recalls  Pain Location:  Bilateral low back (R>L) with radiation circumferentially down the right leg (posterolateral aspect most affected)  NRS:  5-6/10 when sitting.  When walking it goes to 8/10.    Pain Described as:  Aching, cramping.   Numbness/Tingling:  No  Weakness:  Yes (right leg)    Aggravating Factors:  Activity, walking.    Alleviating Factors:  Nothing.     Prior Treatments:  Gabapentin:  Yes (tried in the past during episode 3 years ago without improvement of his pain)  Lyrica:  No  Cymbalta:  No  Amitriptyline:  No  Nortriptyline:   No  Muscle relaxants:  No  Narcotics:  No  Physical Therapy:  No   HEP:  No  Tylenol:  Yes (helps some)  Ibuprofen:  Yes (helps some)  Medrol dose pak:  Yes (helped the first day)    Prior Injections:    ESI in [place] (2019, didn't help)  ESI by Dr. Robley Fries, pain improved (2019)    Prior Relevant Surgeries:  2009 L5-S1 microdisectomy  L4-5 microdiscectomy          Review of Systems   Musculoskeletal: Positive for back pain, gait problem and myalgias.        Right leg pain   All other systems reviewed and are negative.      Current Outpatient Medications:   ?  acetaminophen (TYLENOL) 500 mg tablet, Take 1,000 mg by mouth as Needed for Pain. Max of 4,000 mg of acetaminophen in 24 hours., Disp: , Rfl:   ?  albuterol (PROAIR HFA, VENTOLIN HFA, OR PROVENTIL HFA) 90 mcg/actuation inhaler, Inhale 2 puffs by mouth into the lungs every 6 hours as needed for Wheezing or Shortness of Breath. Shake well before use., Disp: , Rfl:   ?  aspirin EC 81 mg tablet, Take one tablet by mouth daily. Take with food., Disp: 90 tablet, Rfl:   ?  atorvastatin (LIPITOR) 10 mg tablet, Take one tablet by mouth daily., Disp: 90 tablet, Rfl: 3  ?  fexofenadine-pseudoephedrine (ALLEGRA-D 24) 180-240 mg tablet, Take 1 tablet by mouth daily as needed., Disp: , Rfl:   ?  fluticasone propionate (FLONASE) 50 mcg/actuation nasal spray, Apply 2 sprays to each nostril as directed daily as needed. Shake bottle gently before using., Disp: , Rfl:   ?  fluticasone propionate (FLOVENT HFA) 110 mcg/actuation inhaler, Inhale 1 puff by mouth into the lungs twice daily., Disp: , Rfl:   ?  furosemide (LASIX) 40 mg tablet, TAKE 1 TABLET BY MOUTH EVERY DAY IN THE MORNING, Disp: 90 tablet, Rfl: 0  ?  metoprolol XL (TOPROL XL) 25 mg extended release tablet, TAKE 1 TABLET BY MOUTH TWICE A DAY, Disp: 180 tablet, Rfl: 3  ?  potassium chloride (KLOR-CON) 10 mEq tablet, TAKE ONE TABLET BY MOUTH DAILY. TAKE WITH A MEAL AND A FULL GLASS OF WATER., Disp: 90 tablet, Rfl: 3  ?  pregabalin (LYRICA) 50 mg capsule, Take one capsule by mouth three times daily. Take 1 cap qhs x3d, then 1 cap bid x3d, then 1 cap tid thereafter, Disp: 90 capsule, Rfl: 1  ?  ramipriL (ALTACE) 2.5 mg capsule, TAKE 1 CAPSULE BY MOUTH TWICE A DAY, Disp: 180 capsule, Rfl: 0  ?  spironolactone (ALDACTONE) 25 mg tablet, Take one-half tablet by mouth daily. Take with food., Disp: 90 tablet, Rfl: 3  ?  tiZANidine (ZANAFLEX) 4 mg tablet, Take one tablet by mouth at bedtime as needed., Disp: 120 tablet, Rfl: 1  Allergies   Allergen Reactions   ? Penicillin G HIVES and RASH       Medical History:   Diagnosis Date   ? Asthma    ? Back pain, chronic 04/19/2018   ? Degenerative disc disease, lumbar early 2003, & 2008    bulging disk surgery   ? Dyslipidemia 04/19/2018   ? ED (erectile dysfunction)    ? Essential hypertension 04/19/2018   ? HTN (hypertension)    ? Hyperlipidemia 04/25/2018   ? Joint pain     knee replacement   ? Obesity    ? Other malignant neoplasm without specification of site [facility])     prostate, basil cell   ? 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 HX TONSILLECTOMY  1979   ? PROSTATECTOMY  09/05/10    robot-assisted laparoscopic; non nerve sparing   ? HX JOINT REPLACEMENT  2018    knee replacement   ? KNEE SURGERY  2018    knee replacement   ? ANGIOGRAPHY CORONARY ARTERY WITH LEFT HEART CATHETERIZATION N/A 06/05/2018    Performed by Myriam Jacobson, MD at [facility] CATH LAB   ? POSSIBLE PERCUTANEOUS CORONARY STENT PLACEMENT WITH ANGIOPLASTY N/A 06/05/2018    Performed by Myriam Jacobson, MD at [facility] CATH LAB   ? HX LUMBAR LAMINECTOMY  2003 & 2008   ? TONSILLECTOMY  ~ late 1970s   ? VASECTOMY     ? WISDOM TEETH EXTRACTION  Family History Problem Relation Age of Onset   ? Cancer-Prostate Father    ? Cancer Father         prostate   ? Diabetes Father    ? Hypertension Father    ? High Cholesterol Father    ? Arthritis Father    ? Back pain Father    ? High Cholesterol Mother    ? Asthma Mother    ? Arthritis Mother    ? Cancer Maternal Uncle    ? High Cholesterol Maternal Uncle    ? Cancer Maternal Grandmother    ? Heart Failure Maternal Grandmother    ? Heart Failure Maternal Grandfather    ? Hypertension Maternal Grandfather    ? Heart Disease Paternal Grandfather         Male Opioid Risk Score: 0 (07/05/2020  8:20 AM)     Is a controlled substance agreement on file?No    Physical examination:   BP 133/86  - Pulse 97  - Temp 36.7 ?C (98.1 ?F)  - Ht 172.7 cm (68)  - Wt 108 kg (238 lb 3.2 oz)  - SpO2 99%  - BMI 36.22 kg/m?   Pain Score: Eight    General: Alert, cooperative, no distress  Head: Normocephalic, atraumatic  Eyes: Conjunctivae/corneas clear  Lungs: Unlabored respirations  Heart: Normal rate by palpation of pulse  Abdomen: Non-distended  Skin: Warm and dry to touch  Psychiatric: Mood and affect normal  Musculoskeletal: Moves all extremities  Neurological: Grossly intact.  CN II to XII intact    MS:   Root Right Left   Hip Flexion L2 5 5   Knee Flexion L5/S1 5 5   Knee Extension L3 5 5   Dorsiflexion L4 5 5   Plantarflexion S1 5 5   EHL Extension L5 5 5   Negative SLR bilaterally  Tenderness to palpation in lumbar spine bilaterally  Negative FABER bilaterally  Pain with lumbar extension  Gait was smooth and symmetric with equal arm swing.       I reviewed the following with the patient:    Spine X-Ray Results:  None new relevant imaging for review.    MRI C-Spine Results:  None new relevant imaging for review.    MRI L-Spine Results:  None new relevant imaging for review.    MRI T-Spine Results:  None new relevant imaging for review.         ASSESSMENT:    1. Lumbar radiculopathy  L SPINE COMPLETE    MRI L-SPINE WO CONTRAST    pregabalin (LYRICA) 50 mg capsule   2. Spinal stenosis of lumbar region with neurogenic claudication  L SPINE COMPLETE    MRI L-SPINE WO CONTRAST    pregabalin (LYRICA) 50 mg capsule   3. Myalgia, other site  tiZANidine (ZANAFLEX) 4 mg tablet        Alexander Harrison presents with a history, exam, and diagnostic work up indicative of lumbar radiculopathy and spinal stenosis.  He does report a claudicant nature of his pain at this time.  His last MRI was over 3 years ago and the pain presentation has changed from his typical chronic pain. Further, he noted having an ESI that did not work in the past.  Given this, will obtain updated imaging to ascertain etiology and cause of his pain.  Pending imaging, we can provide an injection at the appropriate level.  In interim, will trial him on lyrica given his failure on gabapentin  in the past, and tizanidine for his myofascial associated pain.  HEP was also provided for him today.  Once MRI is obtained, will have him return to clinic to review the imaging to formulate next steps in care.     Patient has had an adequate trial of > 12 month of rest, exercise, multimodal treatment, and the passage of time without improvement of symptoms. The pain has significant impact on the daily quality of life.     PLAN:      1. Diagnostics:  L spine xray and MRI to evaluate if new disc herniation occurred and to evaluate degree of lumbar stenosis  2. Therapy:  HEP for now  3. Medication changes:  Trial tizanidine and lyrica  4. Interventions:  Pending MRI results.    5. Follow up:  After MRI for review    Thank you for this kind referral for consultation. Please feel free to contact me with any questions or concerns.    CC:  Jackquline Berlin, DO

## 2020-07-28 ENCOUNTER — Encounter: Admit: 2020-07-28 | Discharge: 2020-07-28 | Payer: BC Managed Care – PPO

## 2020-07-28 MED ORDER — POTASSIUM CHLORIDE 10 MEQ PO TBER
ORAL_TABLET | Freq: Every day | 0 refills | 30.00000 days | Status: AC
Start: 2020-07-28 — End: ?

## 2020-07-31 ENCOUNTER — Encounter: Admit: 2020-07-31 | Discharge: 2020-07-31 | Payer: BC Managed Care – PPO

## 2020-07-31 DIAGNOSIS — M48062 Spinal stenosis, lumbar region with neurogenic claudication: Secondary | ICD-10-CM

## 2020-07-31 DIAGNOSIS — M5416 Radiculopathy, lumbar region: Secondary | ICD-10-CM

## 2020-08-01 ENCOUNTER — Ambulatory Visit: Admit: 2020-08-01 | Discharge: 2020-07-31 | Payer: BC Managed Care – PPO

## 2020-08-06 ENCOUNTER — Encounter: Admit: 2020-08-06 | Discharge: 2020-08-06 | Payer: BC Managed Care – PPO

## 2020-08-06 ENCOUNTER — Ambulatory Visit: Admit: 2020-08-06 | Discharge: 2020-08-06 | Payer: BC Managed Care – PPO

## 2020-08-06 DIAGNOSIS — M48062 Spinal stenosis, lumbar region with neurogenic claudication: Secondary | ICD-10-CM

## 2020-08-06 DIAGNOSIS — M5136 Other intervertebral disc degeneration, lumbar region: Secondary | ICD-10-CM

## 2020-08-06 DIAGNOSIS — N529 Male erectile dysfunction, unspecified: Secondary | ICD-10-CM

## 2020-08-06 DIAGNOSIS — C801 Malignant (primary) neoplasm, unspecified: Secondary | ICD-10-CM

## 2020-08-06 DIAGNOSIS — C61 Malignant neoplasm of prostate: Secondary | ICD-10-CM

## 2020-08-06 DIAGNOSIS — I10 Essential (primary) hypertension: Secondary | ICD-10-CM

## 2020-08-06 DIAGNOSIS — M549 Dorsalgia, unspecified: Secondary | ICD-10-CM

## 2020-08-06 DIAGNOSIS — E669 Obesity, unspecified: Secondary | ICD-10-CM

## 2020-08-06 DIAGNOSIS — M5416 Radiculopathy, lumbar region: Secondary | ICD-10-CM

## 2020-08-06 DIAGNOSIS — M2550 Pain in unspecified joint: Secondary | ICD-10-CM

## 2020-08-06 DIAGNOSIS — E785 Hyperlipidemia, unspecified: Secondary | ICD-10-CM

## 2020-08-06 NOTE — Progress Notes
[facility] CLINIC NOTE       SUBJECTIVE: Alexander Harrison presents today for follow up.  Over time, the pain has progressively gotten worsen.  He notes most of his pain is in the anterolateral aspect of the right leg and some on the right back as well.  He is interested in reviewing his imaging today.           Review of Systems   Musculoskeletal: Positive for back pain and gait problem.        Right leg pain   All other systems reviewed and are negative.      Current Outpatient Medications:   ?  acetaminophen (TYLENOL) 500 mg tablet, Take 1,000 mg by mouth as Needed for Pain. Max of 4,000 mg of acetaminophen in 24 hours., Disp: , Rfl:   ?  albuterol (PROAIR HFA, VENTOLIN HFA, OR PROVENTIL HFA) 90 mcg/actuation inhaler, Inhale 2 puffs by mouth into the lungs every 6 hours as needed for Wheezing or Shortness of Breath. Shake well before use., Disp: , Rfl:   ?  aspirin EC 81 mg tablet, Take one tablet by mouth daily. Take with food., Disp: 90 tablet, Rfl:   ?  atorvastatin (LIPITOR) 10 mg tablet, Take one tablet by mouth daily., Disp: 90 tablet, Rfl: 3  ?  fexofenadine-pseudoephedrine (ALLEGRA-D 24) 180-240 mg tablet, Take 1 tablet by mouth daily as needed., Disp: , Rfl:   ?  fluticasone propionate (FLONASE) 50 mcg/actuation nasal spray, Apply 2 sprays to each nostril as directed daily as needed. Shake bottle gently before using., Disp: , Rfl:   ?  fluticasone propionate (FLOVENT HFA) 110 mcg/actuation inhaler, Inhale 1 puff by mouth into the lungs twice daily., Disp: , Rfl:   ?  furosemide (LASIX) 40 mg tablet, TAKE 1 TABLET BY MOUTH EVERY DAY IN THE MORNING, Disp: 90 tablet, Rfl: 0  ?  metoprolol XL (TOPROL XL) 25 mg extended release tablet, TAKE 1 TABLET BY MOUTH TWICE A DAY, Disp: 180 tablet, Rfl: 3  ?  potassium chloride (KLOR-CON 10) 10 mEq tablet, TAKE 1 TABLET BY MOUTH DAILY. TAKE WITH MEAL AND FULL GLASS OF WATER., Disp: 90 tablet, Rfl: 0  ?  pregabalin (LYRICA) 50 mg capsule, Take one capsule by mouth three times daily. Take 1 cap qhs x3d, then 1 cap bid x3d, then 1 cap tid thereafter, Disp: 90 capsule, Rfl: 1  ?  ramipriL (ALTACE) 2.5 mg capsule, TAKE 1 CAPSULE BY MOUTH TWICE A DAY, Disp: 180 capsule, Rfl: 0  ?  spironolactone (ALDACTONE) 25 mg tablet, Take one-half tablet by mouth daily. Take with food., Disp: 90 tablet, Rfl: 3  ?  tiZANidine (ZANAFLEX) 4 mg tablet, Take one tablet by mouth at bedtime as needed., Disp: 120 tablet, Rfl: 1  Allergies   Allergen Reactions   ? Penicillin G HIVES and RASH     Physical Exam  Vitals:    08/06/20 0944   Pulse: (P) 97   Temp: (P) 36.4 ?C (97.6 ?F)   SpO2: (P) 97%   Weight: (P) 110.2 kg (243 lb)   Height: (P) 170.2 cm (67)   PainSc: (P) Ten        Pain Score: (P) Ten  Body mass index is 38.06 kg/m? (pended).    General: Alert, cooperative, no distress  Head: Normocephalic, atraumatic  Eyes: Conjunctivae/corneas clear  Lungs: Unlabored respirations  Heart: Normal rate by palpation of pulse  Abdomen: Non-distended  Skin: Warm and dry to touch  Psychiatric:  Mood and affect normal  Musculoskeletal: Moves all extremities  Neurological: Grossly intact.  CN II to XII intact  Gait was antalgic    07/31/20 L spine MRI:  ?L3-L4: Annular bulge extending into both foraminal regions. Moderate to   severe central and bilateral foraminal stenosis.     L4-L5: Annular bulge extending into both foraminal regions. Facet and   ligamentous hypertrophic change. Moderate to severe bilateral foraminal   and central stenosis.     L5-S1: Annular bulge extending into both foraminal regions. Moderate   bilateral foraminal stenosis.?         IMPRESSION:  1. Lumbar radiculopathy    2. Spinal stenosis of lumbar region with neurogenic claudication          PLAN:  65 year old male with multi level foraminal stenosis and L3-4 central stenosis with progression of symptoms.  We reviewed his images today and discussed his current pain presentation.  Though he has some claudicant symptoms, he is most symptomatic of his right leg pain.  Given this, will plan to proceed with right L4-5, L5-S1 TFESI at next available.  In interim, he can increase his tizanidine at night as needed.  If it still bothers him, we could consider switching him to another muscle relaxant or starting a neuropathic medication.    1. Right L4-5, L5-S1 TFESI  2. Continue with tizanidine for now.  3. Continue lyrica.         Elvia Collum, MD  Department of Anesthesiology and Pain Medicine  [facility]  Personal Pager [phone]

## 2020-08-12 ENCOUNTER — Encounter: Admit: 2020-08-12 | Discharge: 2020-08-12 | Payer: BC Managed Care – PPO

## 2020-08-12 DIAGNOSIS — M7918 Myalgia, other site: Secondary | ICD-10-CM

## 2020-08-12 MED ORDER — TIZANIDINE 6 MG PO CAP
6 mg | ORAL_CAPSULE | Freq: Every evening | ORAL | 0 refills | Status: AC
Start: 2020-08-12 — End: ?

## 2020-08-13 ENCOUNTER — Encounter: Admit: 2020-08-13 | Discharge: 2020-08-13 | Payer: BC Managed Care – PPO

## 2020-08-13 NOTE — Telephone Encounter
Received call from Mr. Alexander Harrison requesting call back regarding his script sent in yesterday. Per [facility] they have the script ready for pick up. Made Alexander Harrison aware of script. He is Patent attorney. Patient again requesting for disability paperwork. Recommended that Alexander Harrison speak to his PCP about filling out the paperwork. We will send in any documentation requested by the disability company but we do not complete paperwork like that. Patient appreciative of call

## 2020-08-26 ENCOUNTER — Encounter

## 2020-08-26 DIAGNOSIS — M5416 Radiculopathy, lumbar region: Secondary | ICD-10-CM

## 2020-08-26 DIAGNOSIS — M48062 Spinal stenosis, lumbar region with neurogenic claudication: Secondary | ICD-10-CM

## 2020-08-27 ENCOUNTER — Encounter

## 2020-08-27 DIAGNOSIS — M7918 Myalgia, other site: Secondary | ICD-10-CM

## 2020-08-27 DIAGNOSIS — I10 Essential (primary) hypertension: Principal | ICD-10-CM

## 2020-08-27 DIAGNOSIS — C61 Malignant neoplasm of prostate: Secondary | ICD-10-CM

## 2020-08-27 DIAGNOSIS — N529 Male erectile dysfunction, unspecified: Secondary | ICD-10-CM

## 2020-08-27 DIAGNOSIS — E785 Hyperlipidemia, unspecified: Secondary | ICD-10-CM

## 2020-08-27 DIAGNOSIS — M5416 Radiculopathy, lumbar region: Secondary | ICD-10-CM

## 2020-08-27 DIAGNOSIS — M5136 Other intervertebral disc degeneration, lumbar region: Secondary | ICD-10-CM

## 2020-08-27 DIAGNOSIS — E669 Obesity, unspecified: Secondary | ICD-10-CM

## 2020-08-27 DIAGNOSIS — M48062 Spinal stenosis, lumbar region with neurogenic claudication: Secondary | ICD-10-CM

## 2020-08-27 DIAGNOSIS — M2550 Pain in unspecified joint: Secondary | ICD-10-CM

## 2020-08-27 DIAGNOSIS — M549 Dorsalgia, unspecified: Secondary | ICD-10-CM

## 2020-08-27 DIAGNOSIS — C801 Malignant (primary) neoplasm, unspecified: Secondary | ICD-10-CM

## 2020-08-27 MED ORDER — DEXAMETHASONE SODIUM PHOS (PF) 10 MG/ML IJ SOLN
15 mg | Freq: Once | 0 refills | Status: CP
Start: 2020-08-27 — End: ?
  Administered 2020-08-27: 21:00:00 15 mg

## 2020-08-27 MED ORDER — IOHEXOL 240 MG IODINE/ML IV SOLN
2.5 mL | Freq: Once | EPIDURAL | 0 refills | Status: CP
Start: 2020-08-27 — End: ?
  Administered 2020-08-27: 21:00:00 2.5 mL via EPIDURAL

## 2020-08-27 MED ORDER — PREGABALIN 50 MG PO CAP
50 mg | ORAL_CAPSULE | Freq: Three times a day (TID) | ORAL | 1 refills | Status: AC
Start: 2020-08-27 — End: ?

## 2020-08-27 MED ORDER — TIZANIDINE 6 MG PO CAP
6 mg | ORAL_CAPSULE | Freq: Every evening | ORAL | 0 refills | Status: AC
Start: 2020-08-27 — End: ?

## 2020-08-27 NOTE — Patient Instructions
Procedure Completed Today: Lumbar Transforaminal Steroid Injection    Important information following your procedure today: You may drive today    1. Pain relief may not be immediate. It is possible you may even experience an increase in pain during the first 24-48 hours followed by a gradual decrease of your pain.  2. Though the procedure is generally safe and complications are rare, we do ask that you be aware of any of the following:   ? Any swelling, persistent redness, new bleeding, or drainage from the site of the injection.  ? You should not experience a severe headache.  ? You should not run a fever over 101? F.  ? New onset of sharp, severe back & or neck pain.  ? New onset of upper or lower extremity numbness or weakness.  ? New difficulty controlling bowel or bladder function after the injection.  ? New shortness of breath.    If any of these occur, please call to report this occurrence to a nurse at [phone]. If you are calling after 4:00 p.m., on weekends or holidays please call [phone] and ask to have the resident physician on call for the physician paged or go to your local emergency room.  3. You may experience soreness at the injection site. Ice can be applied at 20 minute intervals. Avoid application of direct heat, hot showers or hot tubs today.  4. Avoid strenuous activity today. You may resume your regular activities and exercise tomorrow.  5. Patients with diabetes may see an elevation in blood sugars for 7-10 days after the injection. It is important to pay close attention to your diet, check your blood sugars daily and report extreme elevations to the physician that treats your diabetes.  6. Patients taking a daily blood thinner can resume their regular dose this evening.  7. It is important that you take all medications ordered by your pain physician. Taking medication as ordered is an important part of your pain care plan. If you cannot continue the medication plan, please notify the physician.     Possible side effects to steroids that may occur:  ? Flushing or redness of the face  ? Irritability  ? Fluid retention  ? Change in women?s menses    The following medications were used: Decadron and Contrast Dye

## 2020-08-27 NOTE — Procedures
Attending Surgeon: Elvia Collum, MD    Anesthesia: Local    Pre-Procedure Diagnosis:   1. Lumbar radiculopathy    2. Spinal stenosis of lumbar region with neurogenic claudication    3. Myalgia, other site        Post-Procedure Diagnosis:   1. Lumbar radiculopathy    2. Spinal stenosis of lumbar region with neurogenic claudication    3. Myalgia, other site        Pump Back AMB SPINE INJECT SNRB/TFESI LUMBAR/SACRAL  Procedure: transforaminal epidural    Laterality: right    Location: lumbar -  L5-S1 and L4-5      Consent:   Consent obtained: verbal and written  Consent given by: patient  Risks discussed: allergic reaction, bleeding, infection, nerve damage and seizure  Alternatives discussed: no treatment  Discussed with patient the purpose of the treatment/procedure, other ways of treating my condition, including no treatment/ procedure and the risks and benefits of the alternatives. Patient has decided to proceed with treatment/procedure.        Universal Protocol:  Relevant documents: relevant documents present and verified  Test results: test results available and properly labeled  Imaging studies: imaging studies available  Required items: required blood products, implants, devices, and special equipment available  Site marked: the operative site was marked  Patient identity confirmed: Patient identify confirmed verbally with patient.        Time out: Immediately prior to procedure a time out was called to verify the correct patient, procedure, equipment, support staff and site/side marked as required      Procedures Details:   Indications: pain   Prep: chlorhexidine  Estimated Blood Loss: minimal  Specimens: none  Number of Joints: 2  Approach: right paramedian  Guidance: fluoroscopy  Contrast: Procedure confirmed with contrast under live fluoroscopy.  Needle and Epidural Catheter: quincke  Needle size: 25 G  Injection procedure: Incremental injection, Negative aspiration for blood and Introduced needle  Amount Injected:   L4-5: 2mL  L5-S1: 2mL  Patient tolerance: Patient tolerated the procedure well with no immediate complications. Pressure was applied, and hemostasis was accomplished.  Outcome: Pain unchanged  Comments: INTERVENTIONAL PAIN MANAGEMENT PROCEDURE REPORT    Lumbar Transforaminal Epidural Injection    Date of Service: 08/27/20    Procedure Title(s):    1. Right L4-5, L5-S1 transforaminal epidural injection   2. Intraoperative fluoroscopy     Attending:  Elvia Collum, MD    Anesthesia: Local    Indications: Alexander Harrison is a 65 y.o. male with a diagnosis of lumbar radicular pain. The patient's history and physical exam were reviewed. The risks, benefits and alternatives to the procedure were discussed, and all questions were answered to the patient's satisfaction. The patient agreed to proceed, and written informed consent was obtained.     Procedure in Detail:    The patient was brought into the procedure room and placed in the prone position on the fluoroscopy table. Standard monitors were placed, and vital signs were observed throughout the procedure. The area of the lumbar spine was prepped with 2% chlorhexidine and draped in a sterile manner.     The L4 vertebral body was identified with AP fluoroscopy. An oblique view to the  right was obtained to better visualize the inferior junction of the pedicle and transverse process. The 6 o?clock position of the pedicle was marked and identified. The skin and subcutaneous tissues in the area were anesthetized with 1% lidocaine. A 25-gauge needle was directed  toward the targeted point under fluoroscopy until bone was contacted. The needle was then walked inferiorly until the neural foramen was entered. A lateral fluoroscopic view was then used to place the needle tip at the posterior and cephalad position in the foramen.     The L5 vertebral body was identified with AP fluoroscopy. An oblique view to the  right was obtained to better visualize the inferior junction of the pedicle and transverse process. The 6 o?clock position of the pedicle was marked and identified. The skin and subcutaneous tissues in the area were anesthetized with 1% lidocaine. A 25-gauge needle was directed toward the targeted point under fluoroscopy until bone was contacted. The needle was then walked inferiorly until the neural foramen was entered. A lateral fluoroscopic view was then used to place the needle tip at the posterior and cephalad position in the foramen.     Negative aspiration was confirmed, and 1ml of contrast was injected at each level. Appropriate neurograms were observed under live AP fluoroscopy with no noted vascular or intrathecal uptake. Then, after negative aspiration, a solution consisting of 1.5  mL of sterile saline and 7.5 mg decadron was easily injected at each level. The needle was removed with a saline flush. The patient?s back was cleaned and a bandage was placed over the needle insertion points.    The same procedure was performed on the opposite side? No    Disposition: The patient tolerated the procedure well, and there were no apparent complications. Vital signs remained stable througtout the procedure. The patient was taken to the recovery area where discharge instructions for the procedure were given.     Estimated Blood Loss: Minimal    Specimens: None    Complications: None          Estimated blood loss: none or minimal  Specimens: none  Patient tolerated the procedure well with no immediate complications. Pressure was applied, and hemostasis was accomplished.

## 2020-08-27 NOTE — Progress Notes
[facility]  INTERVENTIONAL PAIN PROCEDURE HISTORY AND PHYSICAL    Cc:  Back and right leg pain    HISTORY OF PRESENT ILLNESS:  Patient presents today with back and right leg pain and is here for TFESI.  Patient denies any new medical conditions or medications.  Patient states they have been off of anticoagulants and antiplatelets as instructed during clinic visit.  Patient denies any recent illnesses, infections, and is off of antibiotics     Medical History:   Diagnosis Date   ? Asthma    ? Back pain, chronic 04/19/2018   ? Degenerative disc disease, lumbar early 2003, & 2008    bulging disk surgery   ? Dyslipidemia 04/19/2018   ? ED (erectile dysfunction)    ? Essential hypertension 04/19/2018   ? HTN (hypertension)    ? Hyperlipidemia 04/25/2018   ? Joint pain     knee replacement   ? Obesity    ? Other malignant neoplasm without specification of site [facility])     prostate, basil cell   ? Prostate cancer (HCC) 05/10/2010       Surgical History:   Procedure Laterality Date   ? HX TONSILLECTOMY  1979   ? PROSTATECTOMY  09/05/10    robot-assisted laparoscopic; non nerve sparing   ? HX JOINT REPLACEMENT  2018    knee replacement   ? KNEE SURGERY  2018    knee replacement   ? ANGIOGRAPHY CORONARY ARTERY WITH LEFT HEART CATHETERIZATION N/A 06/05/2018    Performed by Myriam Jacobson, MD at [facility] CATH LAB   ? POSSIBLE PERCUTANEOUS CORONARY STENT PLACEMENT WITH ANGIOPLASTY N/A 06/05/2018    Performed by Myriam Jacobson, MD at [facility] CATH LAB   ? HX LUMBAR LAMINECTOMY  2003 & 2008   ? TONSILLECTOMY  ~ late 1970s   ? VASECTOMY     ? WISDOM TEETH EXTRACTION         family history includes Arthritis in his father and mother; Asthma in his mother; Back pain in his father; Cancer in his father, maternal grandmother, and maternal uncle; Cancer-Prostate in his father; Diabetes in his father; Heart Disease in his paternal grandfather; Heart Failure in his maternal grandfather and maternal grandmother; High Cholesterol in his father, maternal uncle, and mother; Hypertension in his father and maternal grandfather.    Social History     Socioeconomic History   ? Marital status: Married     Spouse name: Not on file   ? Number of children: Not on file   ? Years of education: Not on file   ? Highest education level: Not on file   Occupational History   ? Not on file   Tobacco Use   ? Smoking status: Never Smoker   ? Smokeless tobacco: Never Used   Substance and Sexual Activity   ? Alcohol use: Yes     Alcohol/week: 1.0 standard drink     Types: 1 Cans of beer per week     Comment: Occasionally   ? Drug use: No   ? Sexual activity: Not Currently     Partners: Female     Birth control/protection: None   Other Topics Concern   ? Not on file   Social History Narrative   ? Not on file       Allergies   Allergen Reactions   ? Penicillin G HIVES and RASH       Vitals:    08/27/20 1517 08/27/20 1535 08/27/20 1540 08/27/20  1545   BP: (!) 160/99 (!) 170/101 (!) 166/103 (!) 150/94   BP Source:       Patient Position: Prone      Pulse:       Resp:       Temp:       TempSrc:       SpO2: 96% 97% 97% 97%   Weight:       Height:       PainSc:           REVIEW OF SYSTEMS: 14 point ROS obtained and negative except for those noted in HPI.      PHYSICAL EXAM:  General: Alert, cooperative, no distress  Head: Normocephalic, atraumatic  Eyes: Conjunctivae/corneas clear  Lungs: Unlabored respirations  Heart: Normal rate by palpation of pulse  Abdomen: Non-distended  Skin: Warm and dry to touch  Psychiatric: Mood and affect normal  Musculoskeletal: Moves all extremities  Neurological: Grossly intact.      IMPRESSION:    1. Lumbar radiculopathy    2. Spinal stenosis of lumbar region with neurogenic claudication    3. Myalgia, other site          PLAN:   Plan to proceed with Right L4-5, L5-S1 TFESI.      Elvia Collum, MD  Department of Anesthesiology and Pain Medicine  [facility]  Personal Pager [phone]

## 2020-08-27 NOTE — Progress Notes

## 2020-09-02 ENCOUNTER — Encounter

## 2020-09-02 DIAGNOSIS — M5416 Radiculopathy, lumbar region: Secondary | ICD-10-CM

## 2020-09-02 DIAGNOSIS — M48062 Spinal stenosis, lumbar region with neurogenic claudication: Secondary | ICD-10-CM

## 2020-09-02 MED ORDER — PREGABALIN 50 MG PO CAP
ORAL_CAPSULE | Freq: Every day | 1 refills | Status: AC
Start: 2020-09-02 — End: ?

## 2020-09-02 NOTE — Telephone Encounter
Patient requesting refill of pregabalin  Last Office Visit: 08/06/20  Next Office Visit: 10/08/20  Last Refill: 08/04/20    Routed to Dr. Chauncy Passy for review

## 2020-09-08 ENCOUNTER — Encounter: Admit: 2020-09-08 | Discharge: 2020-09-08 | Payer: BC Managed Care – PPO

## 2020-09-20 ENCOUNTER — Encounter: Admit: 2020-09-20 | Discharge: 2020-09-20 | Payer: BC Managed Care – PPO

## 2020-09-20 MED ORDER — RAMIPRIL 2.5 MG PO CAP
ORAL_CAPSULE | Freq: Two times a day (BID) | ORAL | 0 refills | Status: AC
Start: 2020-09-20 — End: ?

## 2020-09-20 MED ORDER — FUROSEMIDE 40 MG PO TAB
ORAL_TABLET | Freq: Every day | ORAL | 0 refills | 90.00000 days | Status: AC
Start: 2020-09-20 — End: ?

## 2020-09-28 ENCOUNTER — Encounter: Admit: 2020-09-28 | Discharge: 2020-09-28 | Payer: BC Managed Care – PPO

## 2020-09-29 ENCOUNTER — Inpatient Hospital Stay: Admit: 2020-09-29 | Discharge: 2020-09-29 | Payer: BC Managed Care – PPO

## 2020-09-29 ENCOUNTER — Encounter: Admit: 2020-09-29 | Discharge: 2020-09-29 | Payer: BC Managed Care – PPO

## 2020-09-29 ENCOUNTER — Inpatient Hospital Stay: Admit: 2020-09-29 | Payer: BC Managed Care – PPO

## 2020-09-29 DIAGNOSIS — I2699 Other pulmonary embolism without acute cor pulmonale: Secondary | ICD-10-CM

## 2020-09-29 LAB — COMPREHENSIVE METABOLIC PANEL
Lab: 0.6 mg/dL (ref 0.3–1.2)
Lab: 1.1 mg/dL (ref 0.4–1.24)
Lab: 10 K/UL — ABNORMAL HIGH (ref 3–12)
Lab: 104 MMOL/L (ref 98–110)
Lab: 141 MMOL/L (ref 137–147)
Lab: 175 mg/dL — ABNORMAL HIGH (ref 70–100)
Lab: 20 U/L (ref 7–40)
Lab: 23 mg/dL (ref 7–25)
Lab: 25 U/L (ref 7–56)
Lab: 27 MMOL/L (ref 21–30)
Lab: 3.8 g/dL (ref 3.5–5.0)
Lab: 4.5 MMOL/L (ref 3.5–5.1)
Lab: 78 U/L — ABNORMAL LOW (ref 25–110)
Lab: >60 mL/min (ref >60)

## 2020-09-29 LAB — TROPONIN-I
Lab: 0.6 ng/mL — ABNORMAL HIGH (ref 0.0–0.05)
Lab: 1.6 ng/mL — ABNORMAL HIGH (ref 0.0–0.05)
Lab: 1.6 ng/mL — ABNORMAL HIGH (ref 0.0–0.05)

## 2020-09-29 LAB — BASIC METABOLIC PANEL
Lab: 1.2 mg/dL (ref 0.4–1.24)
Lab: 104 MMOL/L (ref 98–110)
Lab: 139 MMOL/L (ref 137–147)
Lab: 14 — ABNORMAL HIGH (ref 3–12)
Lab: 21 MMOL/L (ref 21–30)
Lab: 21 mg/dL (ref 7–25)
Lab: 216 mg/dL — ABNORMAL HIGH (ref 70–100)
Lab: 4.2 MMOL/L (ref 3.5–5.1)
Lab: 8.7 mg/dL (ref 8.5–10.6)
Lab: >60 mL/min (ref >60)
Lab: 1.2 mg/dL (ref 0.4–1.24)
Lab: 102 MMOL/L (ref 98–110)
Lab: 138 MMOL/L (ref 137–147)
Lab: 14 K/UL — ABNORMAL HIGH (ref 3–12)
Lab: 21 mg/dL (ref 7–25)
Lab: 22 MMOL/L (ref 21–30)
Lab: 261 mg/dL — ABNORMAL HIGH (ref 70–100)
Lab: 4.2 MMOL/L (ref 3.5–5.1)
Lab: 8.6 mg/dL (ref 8.5–10.6)
Lab: >60 mL/min (ref >60)

## 2020-09-29 LAB — CBC
Lab: 13 % (ref 11–15)
Lab: 13 K/UL — ABNORMAL HIGH (ref 4.5–11.0)
Lab: 158 K/UL (ref 150–400)
Lab: 30 pg (ref 26–34)
Lab: 34 g/dL (ref 32.0–36.0)
Lab: 8.4 FL (ref 7–11)
Lab: 89 FL (ref 80–100)
Lab: 14 K/UL — ABNORMAL HIGH (ref 4.5–11.0)

## 2020-09-29 LAB — HEMOGLOBIN A1C: Lab: 8 % — ABNORMAL HIGH (ref 4.0–6.0)

## 2020-09-29 LAB — PTT (APTT)
Lab: 28 s (ref 24.0–36.5)
Lab: >20 s — ABNORMAL HIGH (ref 24.0–36.5)
Lab: 42 s — ABNORMAL HIGH (ref 24.0–36.5)
Lab: 70 s — ABNORMAL HIGH (ref 24.0–36.5)

## 2020-09-29 LAB — FIBRINOGEN
Lab: 416 mg/dL — ABNORMAL HIGH (ref 200–400)
Lab: 394 mg/dL (ref 200–400)

## 2020-09-29 LAB — BNP (B-TYPE NATRIURETIC PEPTI): Lab: 298 pg/mL — ABNORMAL HIGH (ref 0–100)

## 2020-09-29 LAB — LACTIC ACID (BG - RAPID LACTATE): Lab: 2.3 MMOL/L — ABNORMAL HIGH (ref 0.5–2.0)

## 2020-09-29 LAB — PROTIME INR (PT)
Lab: 1.2 g/dL (ref 0.8–1.2)
Lab: 13 s (ref 8.5–14.4)
Lab: 15 s — ABNORMAL HIGH (ref 8.5–14.4)
Lab: 14 s (ref 8.5–14.4)

## 2020-09-29 LAB — CBC AND DIFF
Lab: 0 K/UL (ref 0–0.20)
Lab: 0 K/UL (ref 0–0.45)
Lab: 1.4 K/UL — ABNORMAL HIGH (ref 0–0.80)
Lab: 13 K/UL — ABNORMAL HIGH (ref 4.5–11.0)

## 2020-09-29 MED ORDER — IOHEXOL 300 MG IODINE/ML IV SOLN
50 mL | Freq: Once | INTRAVENOUS | 0 refills | Status: CP
Start: 2020-09-29 — End: ?

## 2020-09-29 MED ORDER — MELATONIN 5 MG PO TAB
5 mg | Freq: Every evening | ORAL | 0 refills | Status: AC | PRN
Start: 2020-09-29 — End: ?

## 2020-09-29 MED ORDER — FENTANYL CITRATE (PF) 50 MCG/ML IJ SOLN
25 ug | INTRAVENOUS | 0 refills | Status: AC | PRN
Start: 2020-09-29 — End: ?

## 2020-09-29 MED ORDER — ONDANSETRON 4 MG PO TBDI
4 mg | ORAL | 0 refills | Status: AC | PRN
Start: 2020-09-29 — End: ?

## 2020-09-29 MED ORDER — HEPARIN (PORCINE) IN 5 % DEX 20,000 UNIT/500 ML CUSTOM
250 [IU]/h | 0 refills | Status: AC
Start: 2020-09-29 — End: ?

## 2020-09-29 MED ORDER — SENNOSIDES-DOCUSATE SODIUM 8.6-50 MG PO TAB
1 | Freq: Two times a day (BID) | ORAL | 0 refills | Status: AC
Start: 2020-09-29 — End: ?

## 2020-09-29 MED ORDER — HEPARIN (PORCINE) INITIAL BOLUS FOR CONTINUOUS INF (BAG)
7500 [IU] | Freq: Once | INTRAVENOUS | 0 refills | Status: CP
Start: 2020-09-29 — End: ?

## 2020-09-29 MED ORDER — FUROSEMIDE 40 MG PO TAB
40 mg | Freq: Every day | ORAL | 0 refills | Status: AC
Start: 2020-09-29 — End: ?

## 2020-09-29 MED ORDER — METOPROLOL SUCCINATE 25 MG PO TB24
25 mg | Freq: Once | ORAL | 0 refills | Status: CP
Start: 2020-09-29 — End: ?

## 2020-09-29 MED ORDER — POLYETHYLENE GLYCOL 3350 17 GRAM PO PWPK
2 | Freq: Every day | ORAL | 0 refills | Status: DC
Start: 2020-09-29 — End: 2020-09-29

## 2020-09-29 MED ORDER — HYOSCYAMINE SULFATE 0.125 MG PO TBDI
.125 mg | Freq: Once | SUBLINGUAL | 0 refills | Status: CP
Start: 2020-09-29 — End: ?

## 2020-09-29 MED ORDER — FLUTICASONE PROPIONATE 50 MCG/ACTUATION NA SPSN
2 | Freq: Every day | NASAL | 0 refills | Status: AC | PRN
Start: 2020-09-29 — End: ?

## 2020-09-29 MED ORDER — ALBUTEROL SULFATE 90 MCG/ACTUATION IN HFAA
2 | RESPIRATORY_TRACT | 0 refills | Status: DC | PRN
Start: 2020-09-29 — End: 2020-09-29

## 2020-09-29 MED ORDER — HEPARIN (PORCINE) IN 5 % DEX 20,000 UNIT/500 ML (40 UNIT/ML) IV SOLP
0-2000 [IU]/h | INTRAVENOUS | 0 refills | Status: DC
Start: 2020-09-29 — End: 2020-09-29

## 2020-09-29 MED ORDER — ALBUTEROL SULFATE 90 MCG/ACTUATION IN HFAA
2 | RESPIRATORY_TRACT | 0 refills | Status: AC | PRN
Start: 2020-09-29 — End: ?

## 2020-09-29 MED ORDER — POLYETHYLENE GLYCOL 3350 17 GRAM PO PWPK
1 | Freq: Every day | ORAL | 0 refills | Status: DC | PRN
Start: 2020-09-29 — End: 2020-09-29

## 2020-09-29 MED ORDER — SODIUM BICARBONATE 650 MG PO TAB
650 mg | Freq: Two times a day (BID) | ORAL | 0 refills | Status: DC
Start: 2020-09-29 — End: 2020-09-29

## 2020-09-29 MED ORDER — ASPIRIN 81 MG PO TBEC
81 mg | Freq: Every day | ORAL | 0 refills | Status: DC
Start: 2020-09-29 — End: 2020-09-29

## 2020-09-29 MED ORDER — SIMETHICONE 80 MG PO CHEW
80 mg | ORAL | 0 refills | Status: AC | PRN
Start: 2020-09-29 — End: ?

## 2020-09-29 MED ORDER — SENNOSIDES-DOCUSATE SODIUM 8.6-50 MG PO TAB
1 | Freq: Every day | ORAL | 0 refills | Status: DC | PRN
Start: 2020-09-29 — End: 2020-09-29

## 2020-09-29 MED ORDER — ATORVASTATIN 10 MG PO TAB
10 mg | Freq: Every day | ORAL | 0 refills | Status: AC
Start: 2020-09-29 — End: ?

## 2020-09-29 MED ORDER — PREGABALIN 50 MG PO CAP
50 mg | Freq: Three times a day (TID) | ORAL | 0 refills | Status: AC
Start: 2020-09-29 — End: ?

## 2020-09-29 MED ORDER — ONDANSETRON HCL (PF) 4 MG/2 ML IJ SOLN
4 mg | INTRAVENOUS | 0 refills | Status: AC | PRN
Start: 2020-09-29 — End: ?

## 2020-09-29 MED ORDER — POLYETHYLENE GLYCOL 3350 17 GRAM PO PWPK
2 | Freq: Two times a day (BID) | ORAL | 0 refills | Status: AC
Start: 2020-09-29 — End: ?

## 2020-09-29 MED ORDER — POLYETHYLENE GLYCOL 3350 17 GRAM PO PWPK
1 | Freq: Every day | ORAL | 0 refills | Status: DC
Start: 2020-09-29 — End: 2020-09-29

## 2020-09-29 MED ORDER — PERFLUTREN LIPID MICROSPHERES 1.1 MG/ML IV SUSP
1-20 mL | Freq: Once | INTRAVENOUS | 0 refills | Status: CP | PRN
Start: 2020-09-29 — End: ?

## 2020-09-29 MED ORDER — HEPARIN (PORCINE) BOLUS FOR CONTINUOUS INF (BAG)
20-40 [IU]/kg | INTRAVENOUS | 0 refills | Status: DC
Start: 2020-09-29 — End: 2020-09-29

## 2020-09-29 MED ORDER — FLUTICASONE PROPIONATE 110 MCG/ACTUATION IN HFAA
1 | Freq: Two times a day (BID) | RESPIRATORY_TRACT | 0 refills | Status: AC
Start: 2020-09-29 — End: ?

## 2020-09-29 MED ORDER — METOPROLOL SUCCINATE 25 MG PO TB24
25 mg | Freq: Two times a day (BID) | ORAL | 0 refills | Status: AC
Start: 2020-09-29 — End: ?

## 2020-09-29 MED ORDER — SPIRONOLACTONE 25 MG PO TAB
25 mg | Freq: Every day | ORAL | 0 refills | Status: AC
Start: 2020-09-29 — End: ?

## 2020-09-29 MED ORDER — ALTEPLASE INFUSION (CATH CLEARANCE) 10MG/500ML
.5 mg/h | 0 refills | Status: AC
Start: 2020-09-29 — End: ?

## 2020-09-29 MED ORDER — ACETAMINOPHEN 325 MG PO TAB
650 mg | ORAL | 0 refills | Status: DC | PRN
Start: 2020-09-29 — End: 2020-09-29

## 2020-09-29 MED ORDER — METOPROLOL SUCCINATE 25 MG PO TB24
25 mg | Freq: Two times a day (BID) | ORAL | 0 refills | Status: DC
Start: 2020-09-29 — End: 2020-09-29

## 2020-09-29 MED ORDER — ACETAMINOPHEN 500 MG PO TAB
1000 mg | ORAL | 0 refills | Status: AC | PRN
Start: 2020-09-29 — End: ?

## 2020-09-29 MED ORDER — SPIRONOLACTONE 25 MG PO TAB
12.5 mg | Freq: Every day | ORAL | 0 refills | Status: DC
Start: 2020-09-29 — End: 2020-09-29

## 2020-09-29 NOTE — Progress Notes
NAME:Alexander Harrison             MRN: [id number]             DOB:08/22/1955          AGE: 65 y.o.  ADMISSION DATE: 09/29/2020            LOS: 0 days     ACTIVATION/TEAM INFORMATION:  Activating Physician: Padma Poddutoori  Time of Activation Page: 8:08 AM  Time of Assessment: 8:15 AM     PATIENT INFORMATION:  Code Status: Full Code  Representative/DPOA Contact: Wife, Patty Potts   History of Present Illness: Alexander Harrison is a 65 y.o. male with HTN and asthma who transferred from OSH for central PE. At 1900 last night he acutely felt more short of breath and lightheaded. He presented to the ED and they found a saddle embolus on CTA. He was transferred to [place] with possible need for IR intervention. Now he denies SOB (on O2), chest pain, lightheadedness (stopped when he was put on O2). He has had R leg pain since December 2021 that he initially blamed on lumbar radicular pain, but there was no radiation pain like he previously experienced. He denies any long car-rides or procedures since before December 2021     BOVA score 5  Pertinent Social, Family History: never smoker, denies alcohol. No family histroy of clot     ROS:  Musculoskeletal: right leg pain since December. Felt different from previous radicular pain  Cardiovascular: Had some chest pain and pre-syncope before he was put on O2 at OSH ED  Respiratory: Shortness of breath improved since supplemental O2     Physical Exam:   Heart: tachycardic, regular rhythm  Lung: no wheezes, no crackles  Extremity: 1+ pitting edema     Pertinent Lab and EKG Findings:   DDimer: No results found for: DDIMERQ   BNP:   B Type Natriuretic Peptide   Date Value Ref Range Status   09/29/2020 298.0 (H) 0 - 100 PG/ML Final       Troponin:   Troponin-I   Date Value Ref Range Status   09/29/2020 1.65 (H) 0.0 - 0.05 NG/ML Final      Lactate: No results found for: LACTIC, LACTACIDPOC   WBC:   White Blood Cells   Date Value Ref Range Status   09/29/2020 14.3 (H) 4.5 - 11.0 K/UL Final Creatinine:   Creatinine   Date Value Ref Range Status   09/29/2020 1.21 0.4 - 1.24 MG/DL Final      ALT:  ALT (SGPT)   Date Value Ref Range Status   09/29/2020 25 7 - 56 U/L Final     Rhythm/ Rhythm Change: tachycardia     Imaging Findings:   Echo Done: Yes  EF: preserved  CTA Chest for PE: Yes OSH  VQ Scan: No  Chest X-Ray: No        Additional Team Members Involved (If Any): Rodena Piety, Bluford Main  Brief Summary of Team Decision-making: This patient has a submassive PE based on radiologic and echocardiographic evidence. He is not in shock. He has no absolute  contraindications to thrombolysis. The only relative contraindications are distant history of TIAs (3 months) and chronic hypertension  FINAL RECOMMENDATIONS:  - Will proceed with catheter directed thrombolysis  - Continue heparin as managed by IR  - Will be admitted to MICU post-thrombolysis

## 2020-09-29 NOTE — Progress Notes
Attempted to draw labs and place a new IV however unsuccessful.  Place order for VAT to place second IV due to dedicated line for heparin gtt.

## 2020-09-29 NOTE — Progress Notes
Notified AOD of patient's arrival.

## 2020-09-29 NOTE — Consults
Interventional Radiology Consult Note with Pre-procedural History and Physical      Admission Date: 09/29/2020  LOS: 0 days                       Principal Problem:    Acute massive pulmonary embolism (HCC)  Active Problems:    Prostate cancer (HCC)    Essential hypertension    Dyslipidemia    Hyperlipidemia    Combined systolic and diastolic heart failure (HCC)    History of left heart catheterization      Reason for consult: massive saddle PE    Assessment:   - Admitted with SOB transfer from OSH with saddle PE  - Review of imaging significant for massive PE and saddle PE in OSH images loaded in PACS  - RV strain noted on limited bedside ECHO  - pt requiring 10L face mask currently.  at baseline, pt requires no O2 at home  - pt is not SOB with conversation, able to tolerate laying flat in bed when trailed  - PERT activated with decision for IR intervention  - Labs, medications, and allergies meet procedural protocol.   - no recent surgeries reported by pt and wife, pt reports he has history of prostate and skin cancer with no known mets  - Pt is  on a therapeutic blood thinner- heparin gtt infusing  -   Platelet Count   Date Value Ref Range Status   09/29/2020 168 150 - 400 K/UL Final   ;   INR   Date Value Ref Range Status   09/29/2020 1.2 0.8 - 1.2 Final       Plan:  - This case has been reviewed and added onto the [facility] schedule for ASAP.  - ICU team, Dr Constance Goltz, and ICU charge have been notified of pt's pending transfer to ICU for lysis   - For sedation purposes, please keep NPO prior to procedure. May take PO medications with sips of water.    We appreciate being able to participate in this patient's care. Please page with any questions or concerns.    Dollene Primrose, APRN-NP   Pgr [phone]    IR Team Pager [phone] (After-hours and Weekends)  __________________________________________________________________      Procedure: Catheter-directed thrombolysis for pulmonary embolism    IR Pre Procedure Notes:  meds ordered, right IJ access x 2 for bilateral catheters and sheaths as discussed with Dr Laural Benes.  __________________________________________________________________    Chief Complaint:  PE    Previous Anesthetic/Sedation History:  Reviewed.    Code Status: Full Code    History of present illness:  Alexander Harrison is a 65 y.o. 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 IR consulted for lysis. See ROS below for current symptoms    Review of Systems  Constitutional: negative for fevers, chills and sweats  Respiratory: positive for increased work of breathing or dyspnea on exertion, negative for cough or sputum  Cardiovascular: negative for chest pain  Gastrointestinal: negative for nausea and vomiting    Medications  Scheduled Meds:albuterol sulfate (PROAIR HFA) inhaler 2 puff, 2 puff, Inhalation, Q4H & PRN  aspirin EC tablet 81 mg, 81 mg, Oral, QDAY  atorvastatin (LIPITOR) tablet 10 mg, 10 mg, Oral, QDAY  heparin (porcine) BOLUS for continuous inf (bag) [phone] Units, 20-40 Units/kg, Intravenous, As Prescribed  metoprolol XL (TOPROL XL) tablet 25 mg, 25 mg, Oral, BID  senna/docusate (SENOKOT-S) tablet 1  tablet, 1 tablet, Oral, BID    Continuous Infusions:  ? alteplase (CATHFLO ACTIVASE) 10 mg in sodium chloride 0.9% (NS) 500 mL infusion     ? alteplase (CATHFLO ACTIVASE) 10 mg in sodium chloride 0.9% (NS) 500 mL infusion     ? heparin (porcine) 20,000 units/D5W 500 mL infusion (std conc)(premade) 1,864 Units/hr (09/29/20 0845)   ? heparin (porcine) 20,000 units/D5W 500 mL infusion (std conc)(premade)     ? heparin (porcine) 20,000 units/D5W 500 mL infusion (std conc)(premade)       PRN and Respiratory Meds:acetaminophen Q6H PRN, melatonin QHS PRN, ondansetron Q6H PRN **OR** ondansetron (ZOFRAN) IV Q6H PRN, polyethylene glycol 3350 QDAY PRN      Objective                       Vital Signs: Last Filed                 Vital Signs: 24 Hour Range   BP: 166/95 (03/16 0800)  Temp: 36.6 ?C (97.9 ?F) (03/16 0800)  Pulse: 129 (03/16 0800)  Respirations: 20 PER MINUTE (03/16 0800)  SpO2: 97 % (03/16 0800)  Height: 172.7 cm (5' 8) (03/16 0326) BP: (139-166)/(92-95)   Temp:  [36.6 ?C (97.9 ?F)-37.3 ?C (99.2 ?F)]   Pulse:  [124-129]   Respirations:  [20 PER MINUTE-26 PER MINUTE]   SpO2:  [96 %-97 %]      Vitals:    09/29/20 0326   Weight: 110.9 kg (244 lb 7.8 oz)         Intake/Output Summary:  (Last 24 hours)    Intake/Output Summary (Last 24 hours) at 09/29/2020 1610  Last data filed at 09/29/2020 0800  Gross per 24 hour   Intake 0 ml   Output 225 ml   Net -225 ml           Physical Exam  General appearance: alert, cooperative, mild distress and pale  Neurologic: Grossly normal  Lungs: mildly labored with conversation laying flat, 10L face mask  Heart: regular rate and rhythm  Abdomen: soft, distended, round  Extremities: extremities normal, atraumatic, no cyanosis or edema    Airway:  airway assessment performed  Mallampati II (soft palate, uvula, fauces visible)   Anesthesia Classification:  ASA III (A patient with a severe systemic disease that limits activity, but is not incapacitating)  Pre procedure anxiolysis plan: None  Intra-procedural Sedation/Medication Plan: Lidocaine  Personal history of sedation complications: Denies adverse event.   Family history of sedation complications: Denies adverse event.   Medications for Reversal: None  Discussion/Reviews:  Physician has discussed risks and alternatives of this type of sedation and above planned procedures with patient and significant other  NPO Status: Acceptable Acceptable  Pregnancy Status: N/A    Lab/Radiology/Other Diagnostic Tests:  Labs:  Pertinent labs reviewed  Radiology: Reviewed.

## 2020-09-29 NOTE — H&P (View-Only)
Medical Critical Care  History and Physical Assessment      Alexander Harrison  Admission Date:  09/29/2020                       Principal Problem:    Acute massive pulmonary embolism (HCC)  Active Problems:    Prostate cancer (HCC)    Back pain, chronic    Essential hypertension    Dyslipidemia    Class 2 severe obesity due to excess calories with serious comorbidity and body mass index (BMI) of 36.0 to 36.9 in adult [facility])    Hyperlipidemia    Combined systolic and diastolic heart failure (HCC)    History of left heart catheterization    Lumbar radiculopathy, chronic      Alexander Harrison is a 65 y.o. male with past medical history of hypertension, chronic low back pain, asthma, and hyperlipidemia who was transferred to [facility] from OSH for extensive PE resulting in acute hypoxic respiratory failure. Undergoing targeted thrombolysis with IR.       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 Signs of RV strain  -formal ECHO 3/16: EF 50%, borderline systolic function, moderate RV dilation, unable to estimate PAP   -troponin 1.62 on admission to MICU 3/16  -BNP 298  Plan  > continue PTA metoprolol XL 25mg  BID, 25mg  spironolactone daily, and 40 mg PO Lasix daily   > continue PTA atorvastatin 10mg  daily   > cardiology following, will follow up with further recommendations   > trend troponin q6h   > will repeat Lipid panel with AM labs     PULMONARY   Acute hypoxic respiratory failure secondary to pulmonary embolism  Asthma  - uses albuterol inhaler 2-3x daily at home  - does not use home oxygen   - currently requiring 10 L NRB   Plan   > continue PTA inhalers  > oxygen supplementation as needed   > thrombolysis via IR as noted below     GASTROINTESTINAL  Constipation  Abdominal pain  - patient describes pain as gas related, reports last BM was 3/15  Plan  > senokot BID, daily miralax, simethicone BID PRN   > continue to monitor, consider imaging if no improvement with bowel regimen     ENDOCRINE  Hyperglycemia  > f/u A1c with AM labs     RENAL  No acute concerns  - Cr 1.16 (~ 1.2 baseline)  Plan   > monitor with q6h BMP     HEME/ONCOLOGY   Extensive pulmonary embolism    H/o prostate cancer   H/o skin cancer   - transferred from [facility] with massive PE and saddle PE noted on CTA   - requiring 10L HFNC on arrival to [facility]   - INR/protime wnl   - D-Dimer [id number] at OSH   Plan  > thrombolysis + heparin per IR   > q6h CBC  > q6h fibrinogen, PTT, INR    INFECTIOUS DISEASE  - afebrile with mild leukocytosis (13.7)   Plan  > monitor off antibiotics at this time    MSK  Chronic low back pain  Lumbar radiculopathy  - follows  with [place] [facility]   Plan   > continue PTA Lyrica 50mg  TID  > 1g Acetaminophen PRN for back pain    Prophylaxis Review:    Nutrition:  Cardiac diet   GI Prophylaxis:  Not indicated  DVT Prophylaxis:  Heparin drip + SCDs    Disposition/Family:   ICU admission     Code Status:  Full Code    Patient discussed with Dr. Lemar Lofty, DO  Pager [phone]  __________________________________________________________________________________    Chief Complaint:  Pulmonary embolism     History of Present Illness:  Alexander Harrison is a 65 y.o. male with past medical history of hypertension, chronic low back pain, asthma, and hyperlipidemia who was transferred to [facility] from OSH for extensive PE resulting in acute hypoxic respiratory failure. He presented to OSH ER via EMS with cc of sudden onset shortness of breath and chest discomfort while at home. He does note that he has been more sedentary as of late due to his chronic low back pain and recent injection with [facility] 08/27/20.    At outside hospital, lab was remarkable for hemoglobin 15.4, WBCs 13.6, D-dimer 22,300, bicarb 19, anion gap 21, BUN 27, creatinine 1.56, troponin of 0.083 --> 0.043.  proBNP 16.  Underwent CTA chest that showed extensive PE with a saddle PE and right heart strain.  Started on heparin drip there, as well as received 325mg  ASA and nitro.     Admitted to [facility] and evaluated by PERT team. Began thrombolysis with IR morning of 3/16.     Medical History:   Diagnosis Date   ? Asthma    ? Back pain, chronic 04/19/2018   ? Degenerative disc disease, lumbar early 2003, & 2008    bulging disk surgery   ? Dyslipidemia 04/19/2018   ? ED (erectile dysfunction)    ? Essential hypertension 04/19/2018   ? HTN (hypertension)    ? Hyperlipidemia 04/25/2018   ? Joint pain     knee replacement   ? Obesity    ? Other malignant neoplasm without specification of site [facility]     prostate, basil cell   ? Prostate cancer (HCC) 05/10/2010     Surgical History:   Procedure Laterality Date   ? HX TONSILLECTOMY  1979   ? PROSTATECTOMY  09/05/10    robot-assisted laparoscopic; non nerve sparing   ? HX JOINT REPLACEMENT  2018    knee replacement   ? KNEE SURGERY  2018    knee replacement   ? ANGIOGRAPHY CORONARY ARTERY WITH LEFT HEART CATHETERIZATION N/A 06/05/2018    Performed by Myriam Jacobson, MD at [facility] CATH LAB   ? POSSIBLE PERCUTANEOUS CORONARY STENT PLACEMENT WITH ANGIOPLASTY N/A 06/05/2018    Performed by Myriam Jacobson, MD at [facility] CATH LAB   ? HX LUMBAR LAMINECTOMY  2003 & 2008   ? TONSILLECTOMY  ~ late 1970s   ? VASECTOMY     ? WISDOM TEETH EXTRACTION       Family History   Problem Relation Age of Onset   ? Cancer-Prostate Father    ? Cancer Father         prostate   ? Diabetes Father    ? Hypertension Father    ? High Cholesterol Father    ? Arthritis Father    ? Back pain Father    ? High Cholesterol Mother    ? Asthma Mother    ? Arthritis Mother    ?  Cancer Maternal Uncle    ? High Cholesterol Maternal Uncle    ? Cancer Maternal Grandmother ? Heart Failure Maternal Grandmother    ? Heart Failure Maternal Grandfather    ? Hypertension Maternal Grandfather    ? Heart Disease Paternal Grandfather      Social History     Socioeconomic History   ? Marital status: Married     Spouse name: Not on file   ? Number of children: Not on file   ? Years of education: Not on file   ? Highest education level: Not on file   Occupational History   ? Not on file   Tobacco Use   ? Smoking status: Never Smoker   ? Smokeless tobacco: Never Used   Substance and Sexual Activity   ? Alcohol use: Yes     Alcohol/week: 1.0 standard drink     Types: 1 Cans of beer per week     Comment: Occasionally   ? Drug use: No   ? Sexual activity: Not Currently     Partners: Female     Birth control/protection: None   Other Topics Concern   ? Not on file   Social History Narrative   ? Not on file     Immunizations (includes history and patient reported):   There is no immunization history on file for this patient.     Allergies:  Penicillin g    Medications:    Medications Prior to Admission   Medication Sig   ? acetaminophen (TYLENOL) 500 mg tablet Take 1,000 mg by mouth as Needed for Pain. Max of 4,000 mg of acetaminophen in 24 hours.   ? albuterol (PROAIR HFA, VENTOLIN HFA, OR PROVENTIL HFA) 90 mcg/actuation inhaler Inhale 2 puffs by mouth into the lungs every 6 hours as needed for Wheezing or Shortness of Breath. Shake well before use.   ? aspirin EC 81 mg tablet Take one tablet by mouth daily. Take with food.   ? atorvastatin (LIPITOR) 10 mg tablet Take one tablet by mouth daily.   ? fexofenadine-pseudoephedrine (ALLEGRA-D 24) 180-240 mg tablet Take 1 tablet by mouth daily as needed.   ? fluticasone propionate (FLONASE) 50 mcg/actuation nasal spray Apply 2 sprays to each nostril as directed daily as needed. Shake bottle gently before using.   ? fluticasone propionate (FLOVENT HFA) 110 mcg/actuation inhaler Inhale 1 puff by mouth into the lungs twice daily.   ? furosemide (LASIX) 40 mg tablet TAKE 1 TABLET BY MOUTH EVERY DAY IN THE MORNING   ? metoprolol XL (TOPROL XL) 25 mg extended release tablet TAKE 1 TABLET BY MOUTH TWICE A DAY   ? potassium chloride (KLOR-CON 10) 10 mEq tablet TAKE 1 TABLET BY MOUTH DAILY. TAKE WITH MEAL AND FULL GLASS OF WATER.   ? pregabalin (LYRICA) 50 mg capsule TAKE 1 CAP BY MOUTH DAILY AT BEDTIME X3 DAYS THEN 1 CAP TWICE DAILY X3 DAYS THEN 1 CAP 3 TIMES DAILY   ? ramipriL (ALTACE) 2.5 mg capsule TAKE 1 CAPSULE BY MOUTH TWICE A DAY   ? spironolactone (ALDACTONE) 25 mg tablet Take one-half tablet by mouth daily. Take with food.   ? tiZANidine (ZANAFLEX) 6 mg capsule Take one capsule by mouth at bedtime daily.     Scheduled Meds:atorvastatin (LIPITOR) tablet 10 mg, 10 mg, Oral, QDAY  fluticasone propionate (FLOVENT HFA) 110 mcg/actuation inhaler 1 puff, 1 puff, Inhalation, BID  [START ON 09/30/2020] furosemide (LASIX) tablet 40 mg, 40 mg, Oral, QAM8  metoprolol XL (TOPROL XL) tablet 25 mg, 25 mg, Oral, BID  [START ON 09/30/2020] polyethylene glycol 3350 (MIRALAX) packet 34 g, 2 packet, Oral, QDAY  pregabalin (LYRICA) capsule 50 mg, 50 mg, Oral, TID  senna/docusate (SENOKOT-S) tablet 1 tablet, 1 tablet, Oral, BID  [START ON 09/30/2020] spironolactone (ALDACTONE) tablet 12.5 mg, 12.5 mg, Oral, QDAY    Continuous Infusions:  ? alteplase (CATHFLO ACTIVASE) 10 mg in sodium chloride 0.9% (NS) 500 mL infusion 0.5 mg/hr (09/29/20 1134)   ? alteplase (CATHFLO ACTIVASE) 10 mg in sodium chloride 0.9% (NS) 500 mL infusion 0.5 mg/hr (09/29/20 1133)   ? heparin (porcine) 20,000 units/D5W 500 mL infusion (std conc)(premade) 250 Units/hr (09/29/20 1134)   ? heparin (porcine) 20,000 units/D5W 500 mL infusion (std conc)(premade) 250 Units/hr (09/29/20 1132)     PRN and Respiratory Meds:acetaminophen Q4H PRN, albuterol sulfate PRN, fluticasone propionate QDAY PRN, melatonin QHS PRN, ondansetron Q6H PRN **OR** ondansetron (ZOFRAN) IV Q6H PRN, simethicone Q6H PRN    Review of Systems:  Review of Systems   Respiratory: Negative for chest tightness.    Cardiovascular: Negative for chest pain.   Gastrointestinal: Positive for abdominal pain.   Musculoskeletal: Positive for back pain (chronic).   Psychiatric/Behavioral: Negative for confusion.   All other systems reviewed and are negative.      Vital Signs: Last Filed In 24 Hours                Vital Signs: 24 Hour Range   BP: 143/102 (03/16 1400)  Temp: 36.5 ?C (97.7 ?F) (03/16 1200)  Pulse: 116 (03/16 1400)  Respirations: 26 PER MINUTE (03/16 1400)  SpO2: 94 % (03/16 1400)  SpO2 Pulse: 116 (03/16 1400)  Height: 172.7 cm (5' 8) (03/16 1230)  BP: (103-175)/(70-121)   Temp:  [36.5 ?C (97.7 ?F)-37.3 ?C (99.2 ?F)]   Pulse:  [116-132]   Respirations:  [18 PER MINUTE-33 PER MINUTE]   SpO2:  [91 %-100 %]    Intensity Pain Scale (Self Report): (not recorded)    Hemodynamics/Oxycalcs:   Hemodynamics/Oxycalcs  CVP: (!) 25 MM HG (09/29/20 1025)       Physical Exam  Vitals and nursing note reviewed.   Constitutional:       Appearance: He is obese.   HENT:      Head: Normocephalic and atraumatic.   Eyes:      Extraocular Movements: Extraocular movements intact.      Conjunctiva/sclera: Conjunctivae normal.   Cardiovascular:      Rate and Rhythm: Regular rhythm. Tachycardia present.      Pulses: Normal pulses.   Pulmonary:      Effort: Pulmonary effort is normal.      Breath sounds: Decreased breath sounds present.      Comments: On 10 L NRB  Abdominal:      General: Abdomen is protuberant. Bowel sounds are normal.      Palpations: Abdomen is soft.   Neurological:      Mental Status: He is alert and oriented to person, place, and time.   Psychiatric:         Mood and Affect: Mood normal.         Behavior: Behavior normal.         Ventilator/ Respiratory Support:  Yes:   NRB     Lab:  Recent Labs     09/29/20  0650 09/29/20  1218   NA 141 139   K 4.5 4.2   CL 104 104   CO2  27 21   BUN 23 21   CR 1.17 1.21   GLU 175* 216*   GAP 10 14*   CA 9.1 8.7     Recent Labs 09/29/20  0650   ALKPHOS 78   AST 20   ALT 25   TOTPROT 6.6   TOTBILI 0.6   ALBUMIN 3.8     Recent Labs     09/29/20  0650 09/29/20  1218   HGB 14.7 13.9   HCT 43.4 41.0   WBC 13.7* 13.6*   PLTCT 168 158   INR 1.2 1.4*     No results for input(s): PHART, PCO2A, PO2ART, HCO3A, O2SATACAL in the last 72 hours.    Radiology and Other Diagnostic Procedures Review:    See A/P                                                                                       Michelene Heady, DO  Pager [phone]

## 2020-09-29 NOTE — Progress Notes
Main Pulmonary Artery Pressure 26.

## 2020-09-29 NOTE — Progress Notes
Report to [facility], pt will not be returning to 6423.  Remains in IR at this time.  JHaynes

## 2020-09-29 NOTE — Progress Notes
Procedural education done with patient who v/u of procedure. All questions answered. Pt in position of comfort at this time. Safety precautions in place. Will continue to monitor

## 2020-09-29 NOTE — Progress Notes
Report to [place] [id number]., Pt transported from IR to ICU per bed with sheaths intact and infusing to Heparin and Alteplase. Pt AOx4 and having conversation without SOA. Pt remains on non-rebreather 10L O2. Fmaily aware and at bedside.

## 2020-09-29 NOTE — Progress Notes
~  1200: Pt to 6513 accompanied via IR RNs. Pt arrives with heparin and alteplase gtt's going through 4 sheaths in right IJ. All gtt's double verified per [facility]. Pt a&o x4, breathing even, non-labored 10L NRB, plan to transition pt to HFNC. Awaiting provider eval.   .  ~1300: Pt reporting significant abdominal discomfort and tender to palpation, pt states he believes he has gas. Dr. Ladean Raya notified, simethicone and stool softeners ordered and administered.    ~1330: Pt continues to have significant abdominal discomfort with diaphoresis and intermittent hypertension and tachypnea. Dr. Ladean Raya again notified, no new orders at this time.    ~1400: APTT resulted > 200, Dr. Ladean Raya notified. IR provider Elaina Pattee notified. APTT redrawn.     ~1500: Pt had 2 bowel movements and continues to report abdominal pain, Dr. Jeris Penta notified, KUB ordered at this time.     ~1600: Re-assessment completed per ICU doc flowsheet. No acute changes, pt remains on 10L HFNC. Pt continues PRN fentanyl administered per MAR. Will continue to monitor.

## 2020-09-29 NOTE — Other
Immediate Post Procedure Note    Date:  09/29/2020                                         Attending Physician:   Wynetta Emery  Performing Provider:  Bettey Costa, DO    Consent:  Consent obtained from patient.  Time out performed: Consent obtained, correct patient verified, correct procedure verified, correct site verified, patient marked as necessary.  Pre/Post Procedure Diagnosis:  Saddle pulmonary embolism  Indications:  As above.       Procedure(s):  Placement of thrombolysis catheters into bilateral pulmonary arteries.   Findings:  Successful placement of bilateral pulmonary artery thrombolysis catheters.      Estimated Blood Loss:  None/Negligible  Specimen(s) Removed/Disposition:  None  Complications: None  Patient Tolerated Procedure: Well  Post-Procedure Condition:  stable    Bettey Costa, DO

## 2020-09-29 NOTE — Case Management (ED)
Case Management Admission Assessment    NAME:Alexander Harrison                          MRN: [id number]             DOB:1956-01-30          AGE: 65 y.o.  ADMISSION DATE: 09/29/2020             DAYS ADMITTED: LOS: 0 days      Today?s Date: 09/29/2020    Source of Information: Patient      Plan  Plan: Assist PRN with SW/NCM Services, Case Management Assessment     This CM met with pt for assessment on this date.  Provided contact information and explanation of SW/NCM roles.  Reviewed [facility], Preparing for Discharge, and Preferred Provider Network hand-outs.  Provided opportunity for questions and discussion. Pt/family encouraged to contact Case Management team with questions and concerns during hospitalization and until patient is able to transition back to the patient's primary care physician.    ? Patient lives independently at home with his wife, Alexia Freestone, in a single story home with a basement.   ? Patient states that pta that Patty does the cooking and cleaning at home, but aside from that he is independent in all ADLs and mobility. Denies any current DME use or trouble with stairs, although he did state he had a ramp installed to the front door just in case  ? If patient needs assistance at home after discharge, he reports that Alexia Freestone would not be able to provide 24/7 care as she works outside the home as the Geneticist, molecular for the [employer].   ? NCM has reviewed patients EMR  ? NCM attended and participated in MPM huddle (post procedure, patient transitioned to Med ICU 3 service)  ? Primary case management team will continue to follow and assess for discharge needs.     Patient Address/Phone  [address]  [place] [address]  [phone] (home)     Emergency Contact  Extended Emergency Contact Information  Primary Emergency Contact: DAWAUN, [place], [place] [address]  Work Phone: [phone]  Mobile Phone: [phone]  Relation: Spouse  Secondary Emergency Contact: Lorri Frederick [place], [place]  Home Phone: [phone]  Relation: DEPENDENT CHILD    Healthcare Directive         Transportation  Does the Patient Need Case Management to Arrange Discharge Transport? (ex: facility, ambulance, wheelchair/stretcher, Medicaid, cab, other): No  Will the Patient Use Family Transport?: Yes  Transportation Name, Phone and Availability #1: Alexia Freestone (wife) [phone]    Expected Discharge Date  10/02/2020     Living Situation Prior to Admission  ? Living Arrangements  Type of Residence: Home, independent  Living Arrangements: Spouse/significant other (Wife (Patty))  Financial risk analyst / Tub: Tub/Shower Unit  How many levels in the residence?: 2  Can patient live on one level if needed?: Yes  Does residence have entry and/or side stairs?: Yes (2 stairs to get up to front door. Patient additionally had a ramp built just in case)  Assistance needed prior to admit or anticipated on discharge: No  Who provides assistance or could if needed?: Alexia Freestone (wife)  Are they in good health?: Unknown  Can support system provide 24/7 care if needed?: No  ? Level of Function  Prior level of function: Independent  ? Cognitive Abilities   Cognitive Abilities: Alert and Oriented, Engages in problem solving and planning, Participates in decision making, Recognizes impact of health condition on lifestyle, Understands nature of health condition    Financial Resources  ? Coverage  Primary Insurance: Nurse, learning disability ([employer] [facility])  Additional Coverage: RX (Rx covered by [employer] Patient reports that prescriptions are affordable)    ? Source of Income   Source Of Income: Employed (Full time epmloyment with [employer])  ? Financial Assistance Needed?  none    Psychosocial Needs  ? Mental Health  Mental Health History: No  ? Substance Use History  Substance Use History Screen: No  ? Other  n/a    Current/Previous Services  ? PCP  Steva Ready, [phone], [phone]  ? Pharmacy    [facility] [phone] - [place] [place] - [address]  [address]  [place] [address]  Phone: [phone] Fax: [phone]    ? Durable Medical Equipment   Durable Medical Equipment at home: [employer] (used cane in the past after knee replacement. Patient still has cane)  ? Home Health  Receiving home health: In the past  Agency name: [facility] (provided in home PT after knee replacement surgery)  ? Hemodialysis or Peritoneal Dialysis  Undergoing hemodialysis or peritoneal dialysis: No  ? Tube/Enteral Feeds  Receive tube/enteral feeds: No  ? Infusion  Receive infusions: No  ? Private Duty  Private duty help used: No  ? Home and Community Based Services  Home and community based services: No  ? [employer]: N/A  ? Hospice  Hospice: No  ? Outpatient Therapy  PT: In the past  When did patient receive care?: 2019 (PT after knee replacement)  Name of rehab location/group: [facility]  Would patient return for future services?: Yes  OT: No  SLP: No  ? Skilled Nursing Facility/Nursing Home  SNF: No  NH: No  ? Inpatient Rehab  IPR: No  ? Long-Term Acute Care Hospital  LTACH: No  ? Acute Hospital Stay  Acute Hospital Stay: In the past  Was patient's stay within the last 30 days?: No    Iva Lento, BSN, RN, [facility]  Nurse Case Manager

## 2020-09-29 NOTE — Care Coordination-Inpatient
Med-Private Night 4 First-Call (available on Voalte) will take calls on this patient until 8 am on 09/29/2020. Afterwards, please contact Med-Private M team for any questions or concerns. Voalte is the preferred way of communication.     Lyanne Co, M.D.  Night AOD  "On Voalte ---- 8 pm to 8 am"

## 2020-09-30 ENCOUNTER — Inpatient Hospital Stay: Admit: 2020-09-30 | Discharge: 2020-09-30 | Payer: BC Managed Care – PPO

## 2020-10-01 ENCOUNTER — Encounter: Admit: 2020-10-01 | Discharge: 2020-10-01 | Payer: BC Managed Care – PPO

## 2020-10-01 MED FILL — METFORMIN 500 MG PO TAB: 500 mg | ORAL | 30 days supply | Qty: 60 | Status: CN

## 2020-10-01 MED FILL — APIXABAN 5 MG PO TAB: 5 mg | ORAL | 30 days supply | Qty: 70 | Fill #1 | Status: CP

## 2020-10-01 MED FILL — ATORVASTATIN 20 MG PO TAB: 20 mg | ORAL | 30 days supply | Qty: 30 | Fill #1 | Status: CP

## 2020-10-02 ENCOUNTER — Encounter: Admit: 2020-10-02 | Discharge: 2020-10-02 | Payer: BC Managed Care – PPO

## 2020-10-08 ENCOUNTER — Encounter: Admit: 2020-10-08 | Discharge: 2020-10-08 | Payer: BC Managed Care – PPO

## 2020-10-08 ENCOUNTER — Ambulatory Visit: Admit: 2020-10-08 | Discharge: 2020-10-08 | Payer: BC Managed Care – PPO

## 2020-10-08 DIAGNOSIS — I10 Essential (primary) hypertension: Secondary | ICD-10-CM

## 2020-10-08 DIAGNOSIS — E669 Obesity, unspecified: Secondary | ICD-10-CM

## 2020-10-08 DIAGNOSIS — M48062 Spinal stenosis, lumbar region with neurogenic claudication: Secondary | ICD-10-CM

## 2020-10-08 DIAGNOSIS — M2550 Pain in unspecified joint: Secondary | ICD-10-CM

## 2020-10-08 DIAGNOSIS — E785 Hyperlipidemia, unspecified: Secondary | ICD-10-CM

## 2020-10-08 DIAGNOSIS — M5416 Radiculopathy, lumbar region: Secondary | ICD-10-CM

## 2020-10-08 DIAGNOSIS — C61 Malignant neoplasm of prostate: Secondary | ICD-10-CM

## 2020-10-08 DIAGNOSIS — N529 Male erectile dysfunction, unspecified: Secondary | ICD-10-CM

## 2020-10-08 DIAGNOSIS — C801 Malignant (primary) neoplasm, unspecified: Secondary | ICD-10-CM

## 2020-10-08 DIAGNOSIS — M5136 Other intervertebral disc degeneration, lumbar region: Secondary | ICD-10-CM

## 2020-10-08 DIAGNOSIS — M549 Dorsalgia, unspecified: Secondary | ICD-10-CM

## 2020-10-08 IMAGING — US ECHOCOMPL
2 series · 14 of 24 positions shown · non-contrast
Comparison: none

[Series 1: us echo 2d, wo/w m-mode, compl · 86 acquisitions, 13 frames shown (1 of 2)]
[im 1/86]
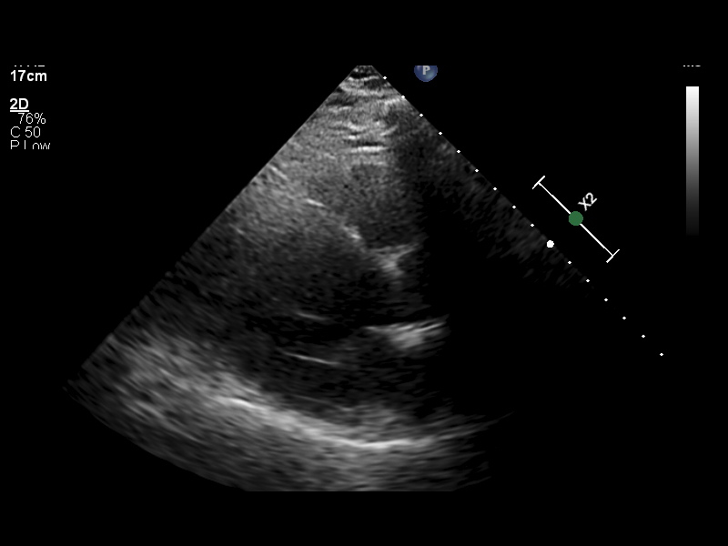
[im 8/86]
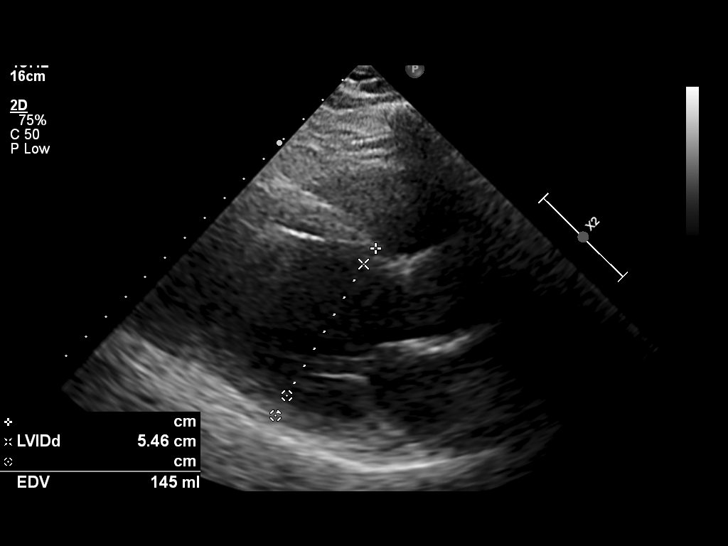
[im 16/86]
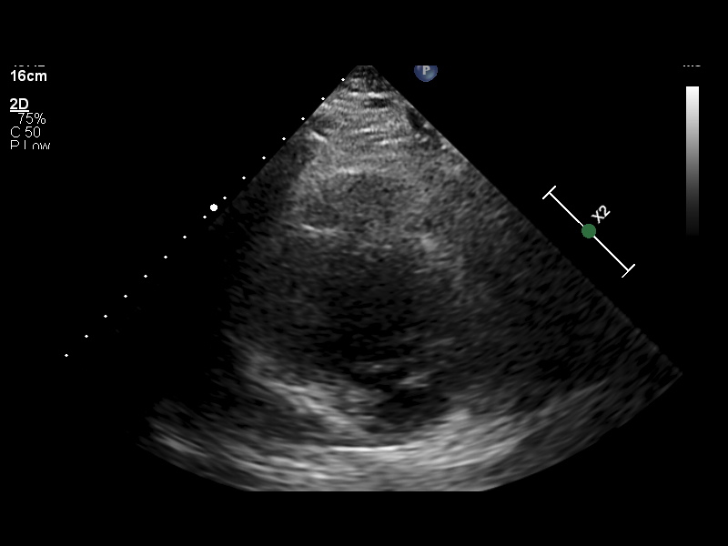
[im 20/86]
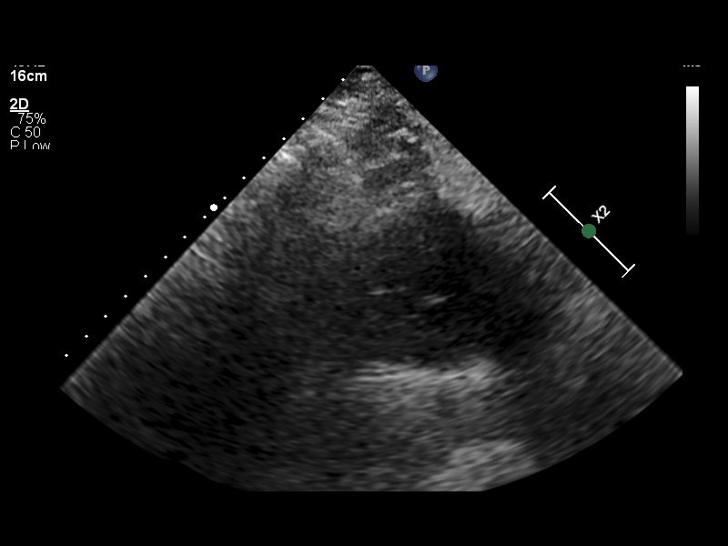
[im 24/86]
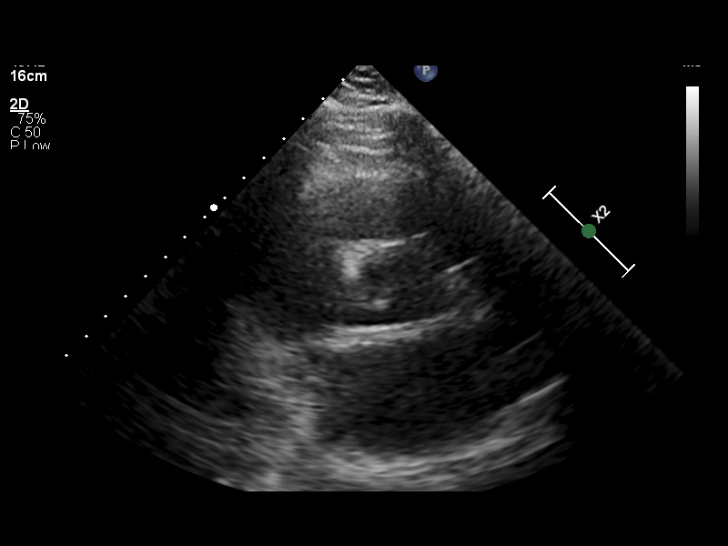
[im 31/86]
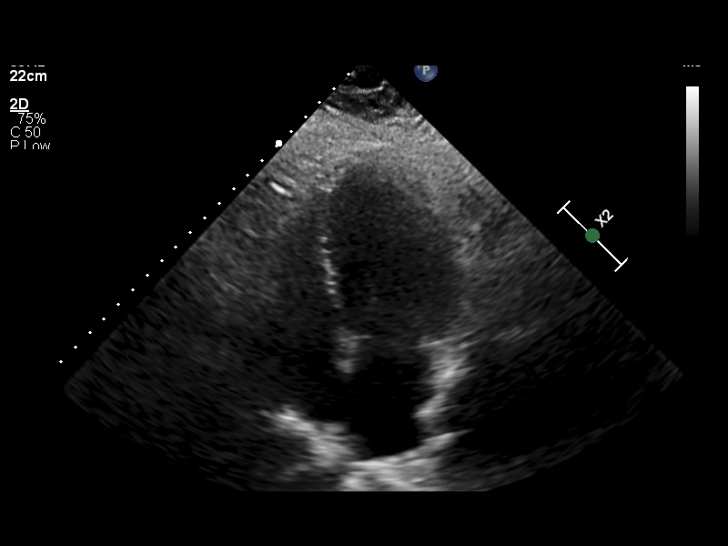
[im 39/86]
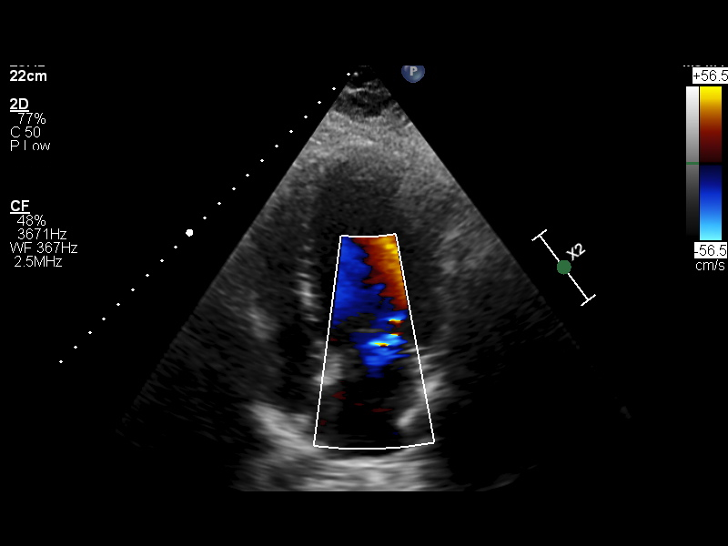
[im 43/86]
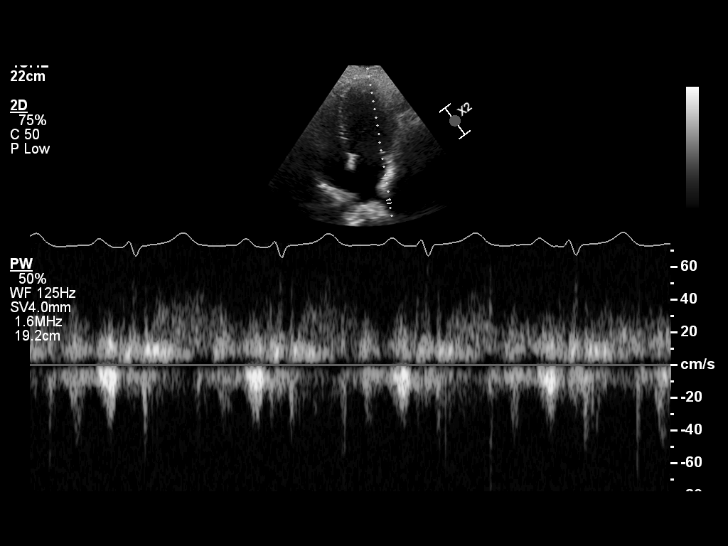
[im 51/86]
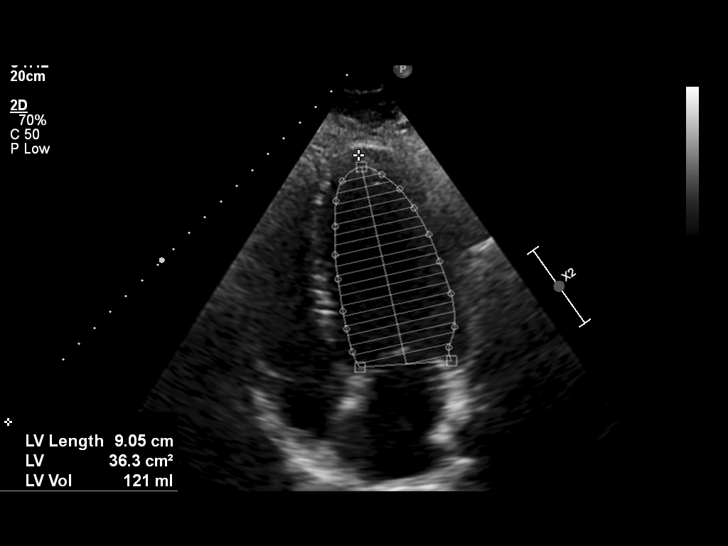
[im 58/86]
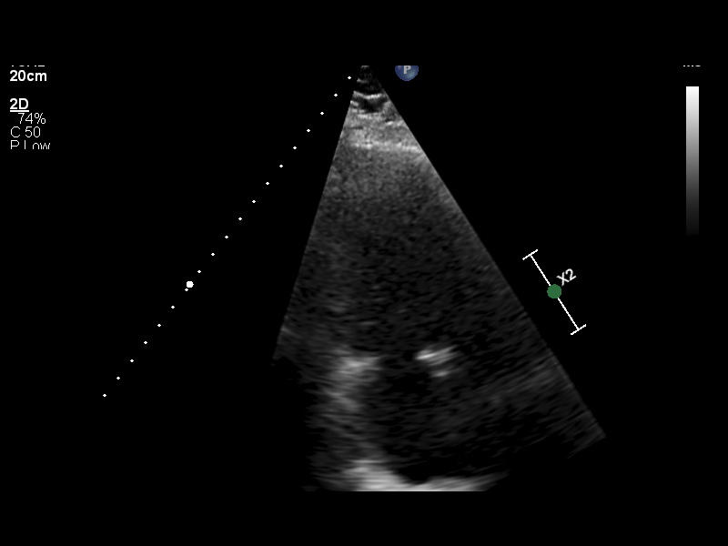
[im 70/86]
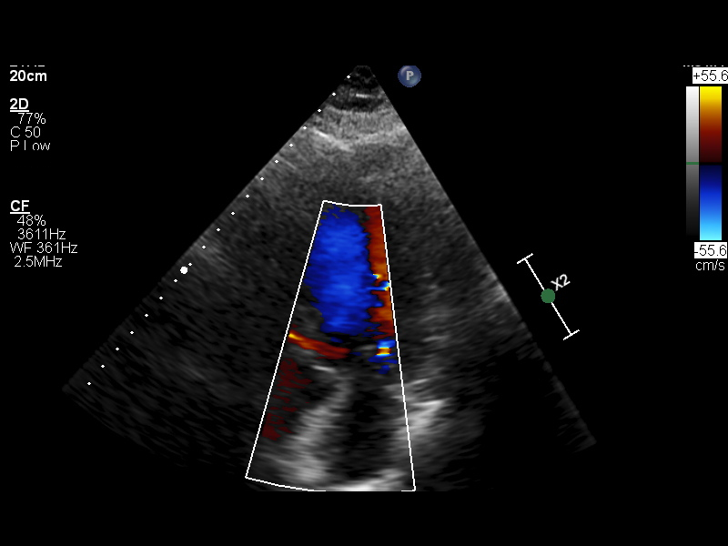
[im 74/86]
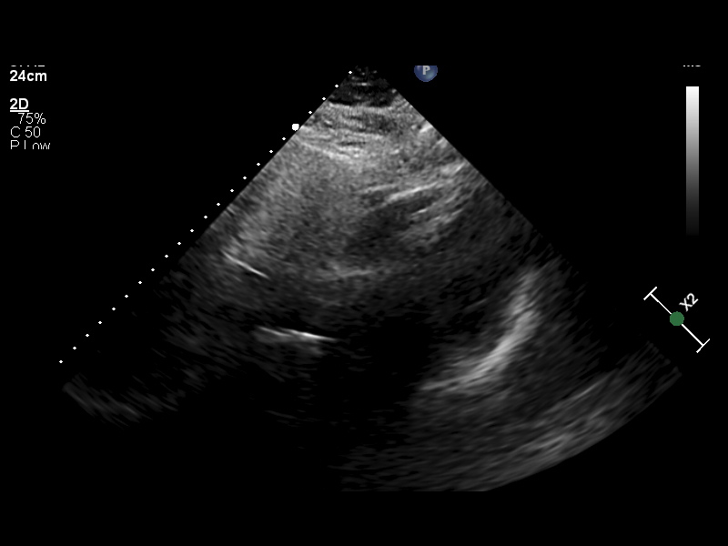
[im 78/86]
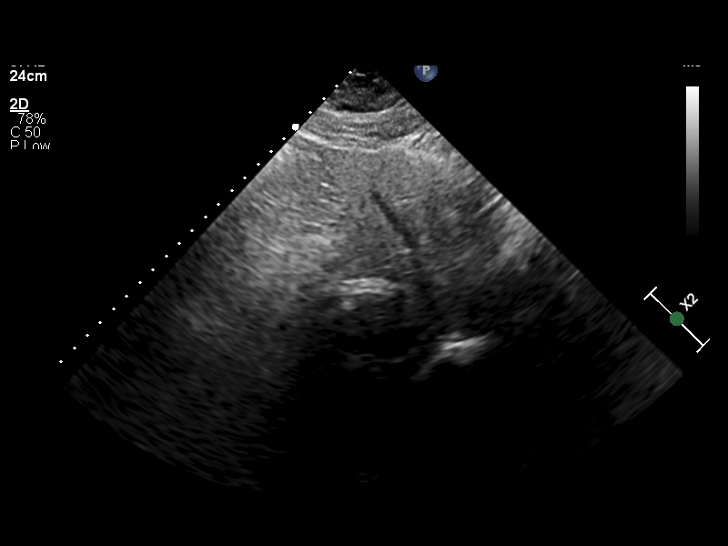

[Series 1: us echo 2d, wo/w m-mode, compl · 1 of 2 slices shown (2 of 2)]
[im 1/2]
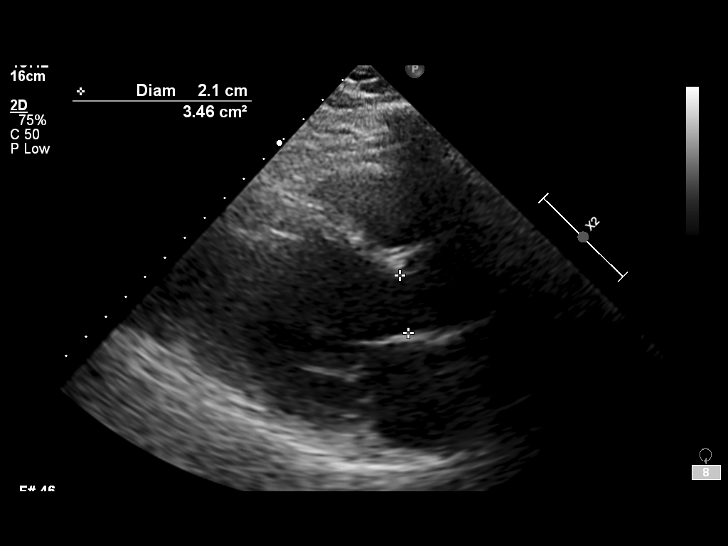

[14 of 24 positions shown; findings below may reference images not displayed]

FINAL REPORT IS SCANNED IN THE PATIENT'S EMR.

Tech Notes:

jl

## 2020-10-08 MED ORDER — PREGABALIN 50 MG PO CAP
50 mg | ORAL_CAPSULE | Freq: Three times a day (TID) | ORAL | 1 refills | Status: AC
Start: 2020-10-08 — End: ?

## 2020-10-08 NOTE — Progress Notes
SPINE CENTER CLINIC NOTE       SUBJECTIVE: Alexander Harrison presents today for follow up.  He states that the injection worked great.  He denies any pain and noted increased ability to perform activities and started exercising.  Unfortunately, he also developed a saddle embolism and was admitted to [place] for 3 days.  He was worked up for this and has started [employer] and ASA for management of this.  He still notes some shortness of breath.     Procedure:  Right L4-5, L5-S1 TFESI  Procedure Date:  08/27/20  Pain site specific NRS:  0/10  Patient reported % relief:   100%         Review of Systems   Respiratory: Positive for shortness of breath.    All other systems reviewed and are negative.      Current Outpatient Medications:   ?  acetaminophen (TYLENOL) 500 mg tablet, Take 1,000 mg by mouth as Needed for Pain. Max of 4,000 mg of acetaminophen in 24 hours., Disp: , Rfl:   ?  albuterol (PROAIR HFA, VENTOLIN HFA, OR PROVENTIL HFA) 90 mcg/actuation inhaler, Inhale 2 puffs by mouth into the lungs every 6 hours as needed for Wheezing or Shortness of Breath. Shake well before use., Disp: , Rfl:   ?  apixaban (ELIQUIS) 5 mg tablet, Take two tablets by mouth twice daily for 5 days. Then start taking one tablet by mouth twice daily thereafter, Disp: 70 tablet, Rfl: 0  ?  apixaban (ELIQUIS) 5 mg tablet, Take one tablet by mouth twice daily. Start this script after finishing initial script., Disp: 120 tablet, Rfl: 0  ?  aspirin EC 81 mg tablet, Take one tablet by mouth daily. Take with food., Disp: 90 tablet, Rfl:   ?  atorvastatin (LIPITOR) 20 mg tablet, Take one tablet by mouth daily., Disp: 90 tablet, Rfl: 0  ?  fexofenadine-pseudoephedrine (ALLEGRA-D 24) 180-240 mg tablet, Take 1 tablet by mouth daily as needed., Disp: , Rfl:   ?  fluticasone propionate (FLONASE) 50 mcg/actuation nasal spray, Apply 2 sprays to each nostril as directed daily as needed. Shake bottle gently before using., Disp: , Rfl:   ?  fluticasone propionate (FLOVENT HFA) 110 mcg/actuation inhaler, Inhale 1 puff by mouth into the lungs twice daily., Disp: , Rfl:   ?  furosemide (LASIX) 40 mg tablet, TAKE 1 TABLET BY MOUTH EVERY DAY IN THE MORNING, Disp: 90 tablet, Rfl: 0  ?  metFORMIN (GLUCOPHAGE) 500 mg tablet, Take one tablet by mouth twice daily with meals., Disp: 60 tablet, Rfl: 0  ?  metoprolol XL (TOPROL XL) 25 mg extended release tablet, TAKE 1 TABLET BY MOUTH TWICE A DAY, Disp: 180 tablet, Rfl: 3  ?  potassium chloride (KLOR-CON 10) 10 mEq tablet, TAKE 1 TABLET BY MOUTH DAILY. TAKE WITH MEAL AND FULL GLASS OF WATER., Disp: 90 tablet, Rfl: 0  ?  pregabalin (LYRICA) 50 mg capsule, Take one capsule by mouth three times daily., Disp: 90 capsule, Rfl: 1  ?  ramipriL (ALTACE) 2.5 mg capsule, TAKE 1 CAPSULE BY MOUTH TWICE A DAY, Disp: 180 capsule, Rfl: 0  ?  spironolactone (ALDACTONE) 25 mg tablet, Take one-half tablet by mouth daily. Take with food., Disp: 90 tablet, Rfl: 3  ?  tiZANidine (ZANAFLEX) 6 mg capsule, Take one capsule by mouth at bedtime daily., Disp: 15 capsule, Rfl: 0  Allergies   Allergen Reactions   ? Penicillin G HIVES and RASH     Physical  Exam  Vitals:    10/08/20 0942   BP: 119/74   Pulse: 102   Temp: 36.7 ?C (98 ?F)   SpO2: 97%   Weight: 107.6 kg (237 lb 1.9 oz)   Height: 172.7 cm (5' 8)   PainSc: One        Pain Score: One  Body mass index is 36.05 kg/m?Marland Kitchen    General: Alert, cooperative, no distress  Head: Normocephalic, atraumatic  Eyes: Conjunctivae/corneas clear  Lungs: Unlabored respirations  Heart: Normal rate by palpation of pulse  Abdomen: Non-distended  Skin: Warm and dry to touch  Psychiatric: Mood and affect normal  Musculoskeletal: Moves all extremities  Neurological: Grossly intact.  CN II to XII intact  Gait was smooth and symmetric with equal arm swing.              IMPRESSION:  1. Lumbar radiculopathy    2. Spinal stenosis of lumbar region with neurogenic claudication          PLAN:  65 year old male with lumbar radiculopathy and stenosis much improved after right L4-5, L5-S1 TFESI.   Will have him continue lyrica for now and have him start PT.  Given recent diagnosis of saddle embolism on AC indefinitely, would to maximize his relief and duration of relief for the time being as we would not be able to repeat the injection until it's safe to stop his AC.  Discussed lyrica renewal with him and he states he will check with Dr. Andreas Newport.  If Dr. Andreas Newport is comfortable refilling lyrica, will have him see [place] as needed. If he is uncomfortable, will see him annually for med check    1. Physician ordered PT  2. Refill lyrica  3. Patient to reach out in the future.          Elvia Collum, MD  Department of Anesthesiology and Pain Medicine  [facility]  Personal Pager [phone]

## 2020-10-18 ENCOUNTER — Encounter: Admit: 2020-10-18 | Discharge: 2020-10-18 | Payer: BC Managed Care – PPO

## 2020-10-18 NOTE — Telephone Encounter
[place] is calling because Dr. Chauncy Passy had written a work release but only releasing him to lift no more than 25 pounds until he checked with pulmonology to see if full duty would be ok with pulm.  Full duty would be 50 lbs or more.  Asking if Dr. Chauncy Passy followed up with pulm or could go ahead and send a full duty work release.      If Dr. Chauncy Passy agrees, RN will mail letter of release to patient's home.

## 2020-10-20 ENCOUNTER — Encounter: Admit: 2020-10-20 | Discharge: 2020-10-20 | Payer: BC Managed Care – PPO

## 2020-10-21 ENCOUNTER — Encounter: Admit: 2020-10-21 | Discharge: 2020-10-21 | Payer: BC Managed Care – PPO

## 2020-10-21 MED ORDER — POTASSIUM CHLORIDE 10 MEQ PO TBER
ORAL_TABLET | Freq: Every day | 3 refills | 30.00000 days | Status: AC
Start: 2020-10-21 — End: ?

## 2020-10-22 ENCOUNTER — Encounter: Admit: 2020-10-22 | Discharge: 2020-10-22 | Payer: BC Managed Care – PPO

## 2020-10-22 DIAGNOSIS — N529 Male erectile dysfunction, unspecified: Secondary | ICD-10-CM

## 2020-10-22 DIAGNOSIS — C801 Malignant (primary) neoplasm, unspecified: Secondary | ICD-10-CM

## 2020-10-22 DIAGNOSIS — I471 Supraventricular tachycardia: Secondary | ICD-10-CM

## 2020-10-22 DIAGNOSIS — I2699 Other pulmonary embolism without acute cor pulmonale: Secondary | ICD-10-CM

## 2020-10-22 DIAGNOSIS — E669 Obesity, unspecified: Secondary | ICD-10-CM

## 2020-10-22 DIAGNOSIS — M5136 Other intervertebral disc degeneration, lumbar region: Secondary | ICD-10-CM

## 2020-10-22 DIAGNOSIS — I10 Essential (primary) hypertension: Secondary | ICD-10-CM

## 2020-10-22 DIAGNOSIS — M2550 Pain in unspecified joint: Secondary | ICD-10-CM

## 2020-10-22 DIAGNOSIS — M549 Dorsalgia, unspecified: Secondary | ICD-10-CM

## 2020-10-22 DIAGNOSIS — C61 Malignant neoplasm of prostate: Secondary | ICD-10-CM

## 2020-10-22 DIAGNOSIS — I428 Other cardiomyopathies: Secondary | ICD-10-CM

## 2020-10-22 DIAGNOSIS — E785 Hyperlipidemia, unspecified: Secondary | ICD-10-CM

## 2020-10-22 DIAGNOSIS — I5042 Chronic combined systolic (congestive) and diastolic (congestive) heart failure: Secondary | ICD-10-CM

## 2020-10-22 DIAGNOSIS — E119 Type 2 diabetes mellitus without complications: Secondary | ICD-10-CM

## 2020-10-22 DIAGNOSIS — E782 Mixed hyperlipidemia: Secondary | ICD-10-CM

## 2020-10-22 NOTE — Progress Notes
Date of Service: 10/22/2020    Alexander Harrison is a 65 y.o. male.       HPI     Mr. Alexander Harrison was seen in our office today for post hospital follow-up visit.  He is accompanied today by his wife.  He is a 65 year old male followed in office by Dr. Nickolas Madrid.  The patient has a medical history significant for nonischemic cardiomyopathy, hypertension, elevated BMI, hyperlipidemia, type 2 diabetes mellitus newly diagnosed (March 2022), acute PE (March 2022), chronic back pain, history of prostate cancer s/p prostatectomy. . An echocardiogram dated 05/14/2018 showed an ejection fraction of 30%.  Patient was evaluated with a left heart catheterization Leven/20/19, he was not found to have any obstructive CAD.  He was started on goal-directed heart failure therapy.  Repeat echo performed in January 2020 demonstrated normal left ventricular systolic function.  He did undergo a 14-day Holter monitor in December 2019 that showed predominantly normal sinus rhythm with an average heart rate of 82 bpm with intermittent bundle branch block.  There were some brief episodes of SVT, the longest lasted 3 seconds.  During the recording.  There were 3 runs of nonsustained VT, mostly monomorphic, longest lasted 4.6 seconds.  Ventricular ectopy less than 1% of total number of beats.  No evidence of atrial fibrillation, atrial flutter and no evidence of high degree AV block.    He was last seen in our office on 12/31/2018 by Dr. Avie Arenas.  No changes were made in his medications.  Risk factor modification weight reduction, lifestyle modifications and a cardiac healthy low-sodium diet were discussed and advised.  He was asked to work on weight reduction and to follow-up in our office in 6 months following this visit.    He was scheduled to follow-up with Dr. Avie Arenas in February 2021, however patient canceled that visit as he had COVID-19 infection.    Unfortunately he suffered an acute massive pulmonary embolism and was admitted to [facility] [place] [facility] from 3/16?10/01/2020.  He was transferred from [facility]  with worsening shortness of breath and findings of submassive saddle pulmonary embolism. He presented with acute onset of shortness of air. Lab was remarkable to hemoglobin 15.4, WBCs 13.6, D-dimer 22,000, bicarb 19, anion gap 21, BUN 27, creatinine 1.56, troponin of 0.083 --> 0.043. BNP normal.  Underwent CTA chest that showed extensive PE with a saddle PE and right heart strain. He was given heparin bolus and  started on heparin drip there. Echocardiogram and chest imaging demonstrated evidence of right heart dysfunction and had elevated cardiac markers.  Given the constellation of findings, a PERT was activated and the consensus opinion was to proceed with thrombolytic therapy, in this case catheter directed. He underwent thrombolysis with IR 3/16. Formal Echo performed on 3/16: normal left ventricular size and systolic function are normal with ejection fraction 50 to 55%, no wall motion abnormalities.  The right ventricle is not well seen with moderately reduced RV systolic function.  Normal biatrial size.  No significant valvular disease.  The pulmonary artery pressure could not be estimated due to inadequate TR signal.  No pericardial effusion. Pulmonary angiography 09/30/20 showed significant improvement in pulmonary artery clot burden, bilaterally. Noted to have main pulmonary artery pressure improvement to 18 cm of H2O, previously 26 cm H2O. Patient's oxygen requirement was weaned as tolerated. Catheters were removed 3/18. Patient's hypoxia fully resolved and he was discharge home, doing well on room air.  He was started on oral anticoagulation  with Eliquis 5 mg twice daily.  Advised to follow-up with his primary care provider and cardiologist.  Additionally, patient was found to have fasting hyperglycemia during admission with hemoglobin A1c of 8.0.  Patient was counseled on this and started on Metformin 500 mg twice daily  at discharge.  His atorvastatin was also increased from 10 mg to 20 mg daily for target LDL < 70.     He had follow-up with Dr. Andreas Newport, PCP on 10/20/2020 get clearance through [employer] for release back to work.  His blood pressure was 110/72, pulse 108 bpm with oxygen sat 95% on room air.  Records indicate that patient has not been checking his blood sugars at home but has started to change his diet and is exercising.  He is also in physical therapy for his back pain.  He is currently on a 25 pound weight restriction.  Patient was asked to start monitoring his blood sugar at home, a glucose monitoring equipment were ordered.  He was advised to continue same medications.  A follow-up visit with their dietitian for diabetic counseling session was set up.  He was asked to return for follow-up in 1 month with blood sugar results.    Today the patient reports that he is feeling  pretty good.. He appears to be stable from a cardiovascular standpoint.  He does have exertional dyspnea with walking longer distances or going up more than 1 flight of stairs.  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  He does not have shortness of air at rest, PND, orthopnea or wheezing.  He denies lower extremity edema and bloating.  He denies having chest pain, exertional chest pain, palpitations, dizziness, lightheadedness, near-syncope and syncope.  He is not reporting TIA or CVA-like symptoms.  He is anticoagulated on Eliquis and denies any missed doses.  He denies any bleeding problems, no GU or GI bleeding.  His biggest complaint today is chronic low back pain, he has lumbar radiculopathy and spinal stenosis.  He follows with Dr. Shayne Alken in the spine center at [facility] and had a injection in his back on 2/11.  He was placed on a 25 pound weight restriction and ordered physical therapy which he  is participating in 3 days a week.  He is also on Lyrica.    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  Since he has been off work more than 30 days he is required to have a physical exam before he returns to work.  He is also following up with Dr. Andreas Newport about this and tells me he believes the weight restriction will be lifted later this month.  His job does entail moving and lifting 50 pound stands at work.  He he is weighing himself every day at home and reports weights ranging from 232 to 238 pounds, 232.4 this morning which correlates with a weight in our office today of 237 pounds.  His weight is stable from hospital discharge.  He is walking daily and has increased his walks from 10 minutes to 20 minutes a day.  He tell me that by the end of his walks he is sometimes short of breath.  Is not smoke or drink alcohol.  His blood pressure today is 119/77, 78 bpm.  Assessment and Plan:.   1.  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  EF 50-55%, no shunt abnormalities and early reduced RV systolic function by echo 09/29/2020, improved from EF of 30% by echocardiogram performed in October 2019 after guideline directed medical therapy was started.  Left heart catheterization in 05/2018 did not show any obstructive coronary artery disease.  Current guideline directed medical therapy for heart failure includes Toprol-XL 25 mg twice daily, ramipril 2.5 mg twice daily,  spironolactone 12.5 mg daily.Marland Kitchen   His weight stable on Lasix 40 mg daily.  He appears euvolemic on examination today.  He is not exhibiting any signs or symptoms of volume overload.  He is normotensive.  His discharge labs on 10/01/20 showed a sodium 138, potassium 4.2 and creatinine 1.0.    2.   Acute pulmonary embolism last month as described above.  Treated with thrombolytic therapy.   Pulmonary angiography 09/30/20 showed significant improvement in pulmonary artery clot burden bilaterally.  He remains on anticoagulation with Eliquis 5 mg twice daily.     3. Nonischemic cardiomyopathy.    4. Hypertension.  Controlled on current therapy.    5. Hyperlipidemia.  Treated. Last LDL 78, triglycerides 132, total cholesterol 133 (09/30/20) .   Atorvastatin was increased to 10 mg to 20 mg daily recent hospitalization to target LDL below 70 with new diagnosis of diabetes.    6.  Type 2 diabetes mellitus.Newly diagnosed during recent hospitalization with A1c 8.0%.  He was started on Metformin 500 mg twice daily.   He had recent follow-up with Dr. Andreas Newport on 4/6 who ordered a glucose monitor and equipment and asked the patient start checking his blood sugars at home and to follow-up in 1 month's blood sugar log.  In addition, he is going to be scheduled with their dietitian for diabetic education and counseling.    7.  Elevated BMI 36.1.    8.  Chronic back pain due to lumbar radiculopathy and spinal stenosis.  Followed in the spine center at [facility].  He is currently on a weight restriction and participating in physical therapy.  He is going to follow-up with Dr. Andreas Newport to determine timing for release of weight restriction and return to work.    9. Allergy-induced asthma.     -Continue current medications, no changes today.  -Continue Eliquis 5 mg twice daily.  -Check BMP next week.  ?Repeat fasting lipid profile, AST and AST in 6 weeks (mid May 2022).  -Risk factors reduction with lifestyle modifications.  ?Recommend weight reduction.  ?Continue daily weights.Marland Kitchen  ?Instructed to call office for weight gain of 3 pounds overnight or 5 pounds in 1 week or if home scale weight goes above 237 pounds (5 pounds of patient's baseline home weight).  ?2 g sodium restricted diet.  ?2 L fluid restriction.  -Recommend weight reduction.  Encourage patient to lose 10 pounds over the next 2 months.  ?Continue regular exercise.  Applauded patient for his efforts at exercise and encouraged him to continue to gradually increase his walking.  I have asked him to increase to 25-minute walks next week for 2 to 3 weeks and then increase to 30 walking most days of the week.  -Needs routine follow-up with primary care or endocrinology for diabetes management.    Follow-up: Follow-up with Dr. Avie Arenas as scheduled on 12/09/2020 in our [facility] clinic.  Patient is encouraged to contact our office if he has problems prior to next visit.  I have educated the patient/family on the plan of care today. Patient/family is in agreement and understanding of this plan.  Instructions are outlined in the after visit summary document.     Thank you for allowing [place] to participate in the care of this individual.  If you have any questions please do not hesitate to contact our office.      Total time spent on today's office visit was 60 minutes.  This includes face-to-face in person visit with patient as well as nonface-to-face time including review of the EMR including hospital records, outside records, labs, radiologic studies, echocardiogram & other cardiovascular studies,assessment,  formation of treatment plan, and on documentation. I reviewed and discussed his/her heart disease, medications/instructions, medication interactions, treatment options/risk/benefits, daily weights, blood pressure monitoring, blood pressure goals, blood pressure monitoring, daily weight monitoring, diet/sodium restriction, fluid restriction, s/s of fluid buildup.  Answered all patient questions to patient's satisfaction. Educated patient on the importance of compliance with recommended medical therapies and outpatient follow-up visits.           DRB  Vitals:    10/22/20 1452   BP: 119/77   BP Source: Arm, Left Upper   Patient Position: Sitting   Pulse: 78   SpO2: 98%   Weight: 107.9 kg (237 lb 12.8 oz)   Height: 172.7 cm (5' 8)   PainSc: Zero     Body mass index is 36.16 kg/m?Marland Kitchen     Past Medical History  Patient Active Problem List    Diagnosis Date Noted   ? Supraventricular tachycardia (HCC) 10/22/2020   ? Nonischemic cardiomyopathy (HCC) 10/22/2020   ? Diabetes (HCC) 10/01/2020   ? Acute massive pulmonary embolism (HCC) 09/29/2020   ? Lumbar radiculopathy, chronic 09/29/2020   ? Weight loss advised 09/06/2018   ? History of left heart catheterization 07/02/2018   ? 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 Abnormal echocardiogram 05/30/2018   ? Abnormal thallium stress test 05/30/2018   ? Bilateral leg edema 04/25/2018   ? At high risk for coronary artery disease 04/25/2018   ? Class 2 severe obesity due to excess calories with serious comorbidity and body mass index (BMI) of 36.0 to 36.9 in adult [facility]) 04/25/2018   ? Hyperlipidemia 04/25/2018   ? Back pain, chronic 04/19/2018   ? Essential hypertension 04/19/2018   ? Dyslipidemia 04/19/2018   ? 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   PSA = 7.25 ng/mL.   CT Scan & Bone Scan --> negative for mets.  RALP, Non-Nerve Sparing -- 09/05/2010  pT2c N0 Mx, Gleason 4+5=9, Margins Neg           Review of Systems   Constitutional: Negative.   HENT: Negative.    Eyes: Negative.    Cardiovascular: Negative.    Respiratory: Negative.    Endocrine: Negative.    Hematologic/Lymphatic: Negative.    Skin: Negative.    Musculoskeletal: Negative.    Gastrointestinal: Negative.    Genitourinary: Negative.    Neurological: Negative.    Psychiatric/Behavioral: Negative.    Allergic/Immunologic: Negative.        Physical Exam  Vital signs were reviewed.   General Appearance:appears well nourished, obese, appears relaxed, in no acute distress,wearing a face mask  Skin: warm, moist, intact, no rash or lesions, no xanthomas  HEENT: unremarkable, pupils equal and round, no scleral icterus, conjunctivae and lids normal  Lips & Mouth: no pallor or cyanosis  Neck Veins:  JVP 7 cm, neck veins are flat, neck veins are not distended, no HJR  Carotid Arteries: normal carotid upstroke bilaterally, no bruits bilaterally  Chest Inspection: chest is normal in appearance  Auscultation/Percussion/Effort: lungs clear to auscultation, no rales, rhonchi, or wheezing, respirations even and unlabored, no respiratory distress  Cardiac Rhythm: regular rhythm and normal rate   Cardiac Auscultation: normal S1 & S2, no S3 or S4, no rub   Murmurs: no cardiac murmurs   Extremities: no lower extremity edema bilaterally, 2+ symmetric distal pulses   Abdominal Exam: soft, non-tender,non-distended, no obvious masses, bowel sounds normal, no guarding  Liver & Spleen: no organomegaly   Neurologic Exam: grossly intact, alert, moves all extremities equally   Orientation: oriented to time, person and place, clear historian  Gait: normal, steady, walks without assistance  Language & Memory: speech clear, patient responsive, seems to comprehend information                  Cardiovascular Health Factors  Vitals BP Readings from Last 3 Encounters:   10/22/20 119/77   10/08/20 119/74   10/01/20 109/57     Wt Readings from Last 3 Encounters:   10/22/20 107.9 kg (237 lb 12.8 oz)   10/08/20 107.6 kg (237 lb 1.9 oz)   09/29/20 109.8 kg (242 lb)     BMI Readings from Last 3 Encounters:   10/22/20 36.16 kg/m?   10/08/20 36.05 kg/m?   09/29/20 36.80 kg/m?      Smoking Social History     Tobacco Use   Smoking Status Never Smoker   Smokeless Tobacco Never Used      Lipid Profile Cholesterol   Date Value Ref Range Status   09/30/2020 133 <200 MG/DL Final     HDL   Date Value Ref Range Status   09/30/2020 42 >40 MG/DL Final     LDL   Date Value Ref Range Status   09/30/2020 78 <100 mg/dL Final     Triglycerides   Date Value Ref Range Status   09/30/2020 132 <150 MG/DL Final      Blood Sugar Hemoglobin A1C   Date Value Ref Range Status   09/29/2020 8.0 (H) 4.0 - 6.0 % Final     Comment:     The ADA recommends that most patients with type 1 and type 2 diabetes maintain   an A1c level <7%.       Glucose   Date Value Ref Range Status   10/01/2020 163 (H) 70 - 100 MG/DL Final   44/07/270 536 (H) 70 - 100 MG/DL Final   64/40/3474 259 (H) 70 - 100 MG/DL Final          Problems Addressed Today  Encounter Diagnoses   Name Primary?   ? Chronic combined systolic and diastolic heart failure (HCC) Yes   ? Supraventricular tachycardia (HCC)    ? Class 2 severe obesity due to excess calories with serious comorbidity and body mass index (BMI) of 36.0 to 36.9 in adult [facility])    ? Acute massive pulmonary embolism (HCC)    ? Essential hypertension    ? Mixed hyperlipidemia    ? Nonischemic cardiomyopathy (HCC)    ? Type 2 diabetes mellitus without complication, without long-term current use of insulin (HCC)                      Current Medications (including today's  revisions)  ? acetaminophen (TYLENOL) 500 mg tablet Take 1,000 mg by mouth as Needed for Pain. Max of 4,000 mg of acetaminophen in 24 hours.   ? albuterol (PROAIR HFA, VENTOLIN HFA, OR PROVENTIL HFA) 90 mcg/actuation inhaler Inhale 2 puffs by mouth into the lungs every 6 hours as needed for Wheezing or Shortness of Breath. Shake well before use.   ? apixaban (ELIQUIS) 5 mg tablet Take two tablets by mouth twice daily for 5 days. Then start taking one tablet by mouth twice daily thereafter   ? apixaban (ELIQUIS) 5 mg tablet Take one tablet by mouth twice daily. Start this script after finishing initial script.   ? aspirin EC 81 mg tablet Take one tablet by mouth daily. Take with food.   ? atorvastatin (LIPITOR) 20 mg tablet Take one tablet by mouth daily.   ? fexofenadine-pseudoephedrine (ALLEGRA-D 24) 180-240 mg tablet Take 1 tablet by mouth daily as needed.   ? fluticasone propionate (FLONASE) 50 mcg/actuation nasal spray Apply 2 sprays to each nostril as directed daily as needed. Shake bottle gently before using.   ? fluticasone propionate (FLOVENT HFA) 110 mcg/actuation inhaler Inhale 1 puff by mouth into the lungs twice daily.   ? furosemide (LASIX) 40 mg tablet TAKE 1 TABLET BY MOUTH EVERY DAY IN THE MORNING   ? metFORMIN (GLUCOPHAGE) 500 mg tablet Take one tablet by mouth twice daily with meals.   ? metoprolol XL (TOPROL XL) 25 mg extended release tablet TAKE 1 TABLET BY MOUTH TWICE A DAY (Patient taking differently: daily.)   ? potassium chloride (KLOR-CON 10) 10 mEq tablet TAKE 1 TABLET BY MOUTH EVERY DAY WITH MEALS AND FULL GLASS OF WATER   ? pregabalin (LYRICA) 50 mg capsule Take one capsule by mouth three times daily.   ? ramipriL (ALTACE) 2.5 mg capsule TAKE 1 CAPSULE BY MOUTH TWICE A DAY   ? spironolactone (ALDACTONE) 25 mg tablet Take one-half tablet by mouth daily. Take with food.

## 2020-10-25 ENCOUNTER — Encounter: Admit: 2020-10-25 | Discharge: 2020-10-25 | Payer: BC Managed Care – PPO

## 2020-10-25 DIAGNOSIS — I5042 Chronic combined systolic (congestive) and diastolic (congestive) heart failure: Secondary | ICD-10-CM

## 2020-10-25 DIAGNOSIS — I428 Other cardiomyopathies: Secondary | ICD-10-CM

## 2020-10-25 DIAGNOSIS — E119 Type 2 diabetes mellitus without complications: Secondary | ICD-10-CM

## 2020-10-25 LAB — BASIC METABOLIC PANEL
Lab: 1.3 — ABNORMAL HIGH (ref 0.72–1.25)
Lab: 106 FL (ref 7–11)
Lab: 136 mL/min — ABNORMAL HIGH (ref 70–105)
Lab: 138 % — ABNORMAL HIGH (ref 11–15)
Lab: 18 — ABNORMAL HIGH (ref 0–14)
Lab: 19 MMOL/L — ABNORMAL LOW (ref 23–31)
Lab: 19 U/L (ref 7–56)
Lab: 4.6 K/UL (ref 150–400)
Lab: 9.6

## 2020-10-26 ENCOUNTER — Encounter: Admit: 2020-10-26 | Discharge: 2020-10-26 | Payer: BC Managed Care – PPO

## 2020-10-26 DIAGNOSIS — I10 Essential (primary) hypertension: Secondary | ICD-10-CM

## 2020-10-26 NOTE — Telephone Encounter
-----   Message from [place]an, APRN-NP sent at 10/25/2020  6:03 PM CDT -----  Nurses, please let the patient know that I reviewed his lab results drawn on today, 10/25/2020.  Electrolytes including sodium potassium are within normal range.  Creatinine is increased from 1.0 on 3/18 to 1.3 currently.  Glucose is slightly elevated but OK.  His baseline creatinine is around 1.1.  Recent hospitalization from 3/16 to 10/01/2020 for PE.  See my office visit note for details if needed.  His creatinine did increase to 1.5 during his hospitalization, down to 1.0 at discharge.  -I did not make any medication changes at his office visit on 10/22/2020.  -His only new meds during hospitalization were Eliquis and Metformin was started for newly diagnosed diabetes.  -Current diuretic is Lasix 40 mg daily and spironolactone 12.5 mg daily--on both these meds prior to recent  admission.  Recommendations:   -Instruct patient to hold Aldactone and ramipril for 2 days (tomorrow, 4/12 and 4/13).  -Start both medications at current dose (spironolactone 12.5 mg daily and ramipril 2.5 mg twice daily) on 4/14.  -Repeat  a BMP in 1 week on 4/18.   Thanks. GM

## 2020-10-26 NOTE — Telephone Encounter
Discussed lab results and recommendations with patient.  He is agreeable to plan and verbalized understanding.  Lab order faxed to [facility] in [place], [place] per patient request.

## 2020-10-30 ENCOUNTER — Encounter: Admit: 2020-10-30 | Discharge: 2020-10-30 | Payer: BC Managed Care – PPO

## 2020-11-10 ENCOUNTER — Encounter: Admit: 2020-11-10 | Discharge: 2020-11-10 | Payer: BC Managed Care – PPO

## 2020-11-10 DIAGNOSIS — E782 Mixed hyperlipidemia: Secondary | ICD-10-CM

## 2020-11-10 DIAGNOSIS — E119 Type 2 diabetes mellitus without complications: Secondary | ICD-10-CM

## 2020-11-10 DIAGNOSIS — I10 Essential (primary) hypertension: Secondary | ICD-10-CM

## 2020-11-10 LAB — LIPID PROFILE
CHOLESTEROL/HDL %: 3
CHOLESTEROL: 135
HDL: 45
LDL: 73
TRIGLYCERIDES: 83
VLDL: 17

## 2020-11-10 LAB — ALT (SGPT): ALT: 31

## 2020-11-10 LAB — AST (SGOT): AST: 29

## 2020-11-10 MED ORDER — ATORVASTATIN 40 MG PO TAB
40 mg | ORAL_TABLET | Freq: Every day | ORAL | 3 refills | Status: AC
Start: 2020-11-10 — End: ?

## 2020-11-10 NOTE — Telephone Encounter
Left message for patient about results and recommendations. Phone number provided for additional questions or concerns. MyChart message also sent to patient with updated information.

## 2020-11-10 NOTE — Telephone Encounter
-----   Message from [place], APRN-NP sent at 11/10/2020  2:08 PM CDT -----  Jinny Blossom, please let patient know I reviewed his Jinny Blossom, please let patient know I reviewed his lab results drawn today, 11/10/2020.  Lipids overall look good with total cholesterol 135, triglycerides 83, HDL 45, LDL 73.  AST and AST are within normal range.  Target LDL is less than 70 and therefore we need to be more aggressive lowering his LDL.  Medications:  - Increase atorvastatin to 40 mg daily.  - Repeat fasting lipid profile, AST and AST in 3 months.  - Please update med page and order labs.  Thanks.  GM

## 2020-12-09 ENCOUNTER — Encounter: Admit: 2020-12-09 | Discharge: 2020-12-09 | Payer: BC Managed Care – PPO

## 2020-12-09 DIAGNOSIS — I10 Essential (primary) hypertension: Secondary | ICD-10-CM

## 2020-12-09 DIAGNOSIS — E119 Type 2 diabetes mellitus without complications: Secondary | ICD-10-CM

## 2020-12-09 DIAGNOSIS — C801 Malignant (primary) neoplasm, unspecified: Secondary | ICD-10-CM

## 2020-12-09 DIAGNOSIS — R600 Localized edema: Secondary | ICD-10-CM

## 2020-12-09 DIAGNOSIS — C61 Malignant neoplasm of prostate: Secondary | ICD-10-CM

## 2020-12-09 DIAGNOSIS — I2699 Other pulmonary embolism without acute cor pulmonale: Secondary | ICD-10-CM

## 2020-12-09 DIAGNOSIS — E782 Mixed hyperlipidemia: Secondary | ICD-10-CM

## 2020-12-09 DIAGNOSIS — E785 Hyperlipidemia, unspecified: Secondary | ICD-10-CM

## 2020-12-09 DIAGNOSIS — Z7901 Long term (current) use of anticoagulants: Secondary | ICD-10-CM

## 2020-12-09 DIAGNOSIS — M549 Dorsalgia, unspecified: Secondary | ICD-10-CM

## 2020-12-09 DIAGNOSIS — Z09 Encounter for follow-up examination after completed treatment for conditions other than malignant neoplasm: Secondary | ICD-10-CM

## 2020-12-09 DIAGNOSIS — Z9189 Other specified personal risk factors, not elsewhere classified: Secondary | ICD-10-CM

## 2020-12-09 DIAGNOSIS — M5136 Other intervertebral disc degeneration, lumbar region: Secondary | ICD-10-CM

## 2020-12-09 DIAGNOSIS — E669 Obesity, unspecified: Secondary | ICD-10-CM

## 2020-12-09 DIAGNOSIS — N529 Male erectile dysfunction, unspecified: Secondary | ICD-10-CM

## 2020-12-09 DIAGNOSIS — I428 Other cardiomyopathies: Secondary | ICD-10-CM

## 2020-12-09 DIAGNOSIS — M2550 Pain in unspecified joint: Secondary | ICD-10-CM

## 2020-12-09 DIAGNOSIS — I5042 Chronic combined systolic (congestive) and diastolic (congestive) heart failure: Secondary | ICD-10-CM

## 2020-12-09 DIAGNOSIS — I471 Supraventricular tachycardia: Secondary | ICD-10-CM

## 2020-12-09 NOTE — Progress Notes
Date of Service: 12/09/2020    Alexander Harrison is a 65 y.o. male.       HPI     Patient is a 65 year old white male with a history of nonischemic cardiomyopathy, history of severe LV dysfunction and an LVEF of 30%, recovered EF, no evidence of obstructive coronary artery disease, status post left heart catheterization in 2019, recent history of shortness of breath and initial evaluation in [facility], history of saddle PE, patient was transferred to [facility].    He was admitted between 09/29/2020 and 10/01/2020.  He did undergo evaluation with a CTA that showed extensive PE with a saddle PE on the right heart strain, he was started on unfractionated Heparin drip, the 2D echo Doppler study demonstrated evidence of right heart dysfunction, the cardiac markers were elevated.  Therefore patient underwent direct thrombolysis, this was performed on 09/29/2020, pulmonary angiography performed on 09/30/2020 showed significant improvement in the pulmonary artery clot, the PA pressure was 18 mmHg.  Patient's oxygen requirement decreased, he was continued on apixaban and discharged home in stable condition.    Also, during this hospitalization patient was found to be hyperglycemic, he was found to have a hemoglobin A1c of 8.0%, he was started on metformin at discharge.    Patient reports symptoms of fatigue and shortness of breath that occur with physical activity, these are probably secondary to recent hospitalization and physical deconditioning.  He has not experienced any further symptoms of chest pain and no heart palpitations.         Vitals:    12/09/20 1528   BP: 112/70   BP Source: Arm, Left Upper   Pulse: 83   SpO2: 96%   O2 Device: None (Room air)   PainSc: Zero   Weight: 102.5 kg (226 lb)   Height: 172.7 cm (5' 8)     Body mass index is 34.36 kg/m?Marland Kitchen     Past Medical History  Patient Active Problem List    Diagnosis Date Noted   ? Supraventricular tachycardia (HCC) 10/22/2020   ? Nonischemic cardiomyopathy (HCC) 10/22/2020   ? Diabetes (HCC) 10/01/2020   ? Acute massive pulmonary embolism (HCC) 09/29/2020   ? Lumbar radiculopathy, chronic 09/29/2020   ? Weight loss advised 09/06/2018   ? History of left heart catheterization 07/02/2018   ? 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 Abnormal echocardiogram 05/30/2018   ? Abnormal thallium stress test 05/30/2018   ? Bilateral leg edema 04/25/2018   ? At high risk for coronary artery disease 04/25/2018   ? Class 2 severe obesity due to excess calories with serious comorbidity and body mass index (BMI) of 36.0 to 36.9 in adult [facility]) 04/25/2018   ? Hyperlipidemia 04/25/2018   ? Back pain, chronic 04/19/2018   ? Essential hypertension 04/19/2018   ? Dyslipidemia 04/19/2018   ? 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   PSA = 7.25 ng/mL.   CT Scan & Bone Scan --> negative for mets.  RALP, Non-Nerve Sparing -- 09/05/2010  pT2c N0 Mx, Gleason 4+5=9, Margins Neg           Review of Systems   Constitutional: Negative.   HENT: Negative.    Eyes: Negative.    Cardiovascular: Negative.    Respiratory: Positive for cough.    Endocrine: Negative.    Hematologic/Lymphatic: Negative.    Skin: Negative.  Musculoskeletal: Negative.    Gastrointestinal: Negative.    Genitourinary: Negative.    Neurological: Negative.    Psychiatric/Behavioral: Negative.    Allergic/Immunologic: Negative.        Physical Exam  General Appearance: normal in appearance  Skin: warm, moist, no ulcers or xanthomas  Eyes: conjunctivae and lids normal, pupils are equal and round  Lips & Oral Mucosa: no pallor or cyanosis  Neck Veins: neck veins are flat, neck veins are not distended  Chest Inspection: chest is normal in appearance  Respiratory Effort: breathing comfortably, no respiratory distress  Auscultation/Percussion: lungs clear to auscultation, no rales or rhonchi, no wheezing  Cardiac Rhythm: regular rhythm and normal rate  Cardiac Auscultation: S1, S2 normal, no rub, no gallop  Murmurs: no murmur  Carotid Arteries: normal carotid upstroke bilaterally, no bruit  Lower Extremity Edema: no lower extremity edema  Abdominal Exam: soft, non-tender, no masses, bowel sounds normal  Liver & Spleen: no organomegaly  Language and Memory: patient responsive and seems to comprehend information  Neurologic Exam: neurological assessment grossly intact      Cardiovascular Studies      Cardiovascular Health Factors  Vitals BP Readings from Last 3 Encounters:   12/09/20 112/70   10/22/20 119/77   10/08/20 119/74     Wt Readings from Last 3 Encounters:   12/09/20 102.5 kg (226 lb)   10/22/20 107.9 kg (237 lb 12.8 oz)   10/08/20 107.6 kg (237 lb 1.9 oz)     BMI Readings from Last 3 Encounters:   12/09/20 34.36 kg/m?   10/22/20 36.16 kg/m?   10/08/20 36.05 kg/m?      Smoking Social History     Tobacco Use   Smoking Status Never Smoker   Smokeless Tobacco Never Used      Lipid Profile Cholesterol   Date Value Ref Range Status   11/10/2020 135  Final     HDL   Date Value Ref Range Status   11/10/2020 45  Final     LDL   Date Value Ref Range Status   11/10/2020 73  Final     Triglycerides   Date Value Ref Range Status   11/10/2020 83  Final      Blood Sugar Hemoglobin A1C   Date Value Ref Range Status   09/29/2020 8.0 (H) 4.0 - 6.0 % Final     Comment:     The ADA recommends that most patients with type 1 and type 2 diabetes maintain   an A1c level <7%.       Glucose   Date Value Ref Range Status   10/25/2020 136 (H) 70 - 105 Final   10/01/2020 163 (H) 70 - 100 MG/DL Final   45/40/9811 914 (H) 70 - 100 MG/DL Final          Problems Addressed Today  Encounter Diagnoses   Name Primary?   ? Essential hypertension Yes   ? Mixed hyperlipidemia    ? Chronic combined systolic and diastolic heart failure (HCC)    ? Acute massive pulmonary embolism (HCC)    ? Supraventricular tachycardia (HCC)    ? Nonischemic cardiomyopathy (HCC)    ? Prostate cancer (HCC)    ? Dyslipidemia    ? Bilateral leg edema    ? At high risk for coronary artery disease    ? Class 2 severe obesity due to excess calories with serious comorbidity and body mass index (BMI) of 36.0 to 36.9 in adult [facility])    ?  Type 2 diabetes mellitus without complication, without long-term current use of insulin (HCC)    ? Hospital discharge follow-up    ? On apixaban therapy        Assessment and Plan       Assessment:    1.  Recent hospitalization at [facility] between 09/29/2020 - 10/01/2020  2.  Saddle pulmonary embolus-patient did undergo direct thrombolysis within the pulmonary artery, this led to significant clot burden  3.  Right heart strain  4.  Elevated cardiac markers due to right heart strain  5.  Currently patient is on apixaban  6.  History of cardiomyopathy which initially diagnosed LVEF of 30 to 30%  7.  Recovered LVEF  8.  Hyperglycemia  9.  Abnormal hemoglobin A1c-patient was initiated on apixaban  10.  No evidence of obstructive coronary artery disease-this patient did undergo previous a left heart catheterization in November 2019, he was not found to have any obstructive CAD    Plan:    1.  Proceed with 2D echo Doppler study evaluation-we will follow-up on the results and call the patient with further recommendations.  2.  Continue all present medications  3.  Pulmonary consultation with a pulmonologist in [facility] for further evaluation and to decide the length of apixaban therapy  4.  Follow-up office visit in 6 months    Total Time Today was 45 minutes in the following activities: Preparing to see the patient, Obtaining and/or reviewing separately obtained history, Performing a medically appropriate examination and/or evaluation, Counseling and educating the patient/family/caregiver, Ordering medications, tests, or procedures, Referring and communication with other health care professionals (when not separately reported), Documenting clinical information in the electronic or other health record, Independently interpreting results (not separately reported) and communicating results to the patient/family/caregiver and Care coordination (not separately reported)         Current Medications (including today's revisions)  ? acetaminophen (TYLENOL) 500 mg tablet Take 1,000 mg by mouth as Needed for Pain. Max of 4,000 mg of acetaminophen in 24 hours.   ? albuterol (PROAIR HFA, VENTOLIN HFA, OR PROVENTIL HFA) 90 mcg/actuation inhaler Inhale 2 puffs by mouth into the lungs every 6 hours as needed for Wheezing or Shortness of Breath. Shake well before use.   ? apixaban (ELIQUIS) 5 mg tablet Take one tablet by mouth twice daily. Start this script after finishing initial script.   ? aspirin EC 81 mg tablet Take one tablet by mouth daily. Take with food.   ? atorvastatin (LIPITOR) 40 mg tablet Take one tablet by mouth daily.   ? fexofenadine-pseudoephedrine (ALLEGRA-D 24) 180-240 mg tablet Take 1 tablet by mouth daily as needed.   ? fluticasone propionate (FLONASE) 50 mcg/actuation nasal spray Apply 2 sprays to each nostril as directed daily as needed. Shake bottle gently before using.   ? fluticasone propionate (FLOVENT HFA) 110 mcg/actuation inhaler Inhale 1 puff by mouth into the lungs twice daily.   ? furosemide (LASIX) 40 mg tablet TAKE 1 TABLET BY MOUTH EVERY DAY IN THE MORNING   ? metFORMIN (GLUCOPHAGE) 500 mg tablet Take one tablet by mouth twice daily with meals.   ? metoprolol XL (TOPROL XL) 25 mg extended release tablet TAKE 1 TABLET BY MOUTH TWICE A DAY (Patient taking differently: Take 25 mg by mouth twice daily.)   ? potassium chloride (KLOR-CON 10) 10 mEq tablet TAKE 1 TABLET BY MOUTH EVERY DAY WITH MEALS AND FULL GLASS OF WATER   ? pregabalin (LYRICA) 50 mg capsule  Take one capsule by mouth three times daily.   ? ramipriL (ALTACE) 2.5 mg capsule TAKE 1 CAPSULE BY MOUTH TWICE A DAY   ? spironolactone (ALDACTONE) 25 mg tablet Take one-half tablet by mouth daily. Take with food.

## 2020-12-17 ENCOUNTER — Encounter: Admit: 2020-12-17 | Discharge: 2020-12-17 | Payer: BC Managed Care – PPO

## 2020-12-17 NOTE — Telephone Encounter
Referral order and [place] office visit notes faxed to [facility] pulmonary clinic. Appointment scheduled for December 31, 2020 at 10:30 with Dr. Delsa Bern.     Left VM to notify patient of time, date and location of appointment. Provided that office's phone number in case he would need to cancel or reschedule.

## 2021-01-03 ENCOUNTER — Ambulatory Visit: Admit: 2021-01-03 | Discharge: 2021-01-03 | Payer: BC Managed Care – PPO

## 2021-01-03 ENCOUNTER — Encounter: Admit: 2021-01-03 | Discharge: 2021-01-03 | Payer: BC Managed Care – PPO

## 2021-01-03 DIAGNOSIS — I428 Other cardiomyopathies: Secondary | ICD-10-CM

## 2021-01-03 DIAGNOSIS — E119 Type 2 diabetes mellitus without complications: Secondary | ICD-10-CM

## 2021-01-03 DIAGNOSIS — Z9189 Other specified personal risk factors, not elsewhere classified: Secondary | ICD-10-CM

## 2021-01-03 DIAGNOSIS — I10 Essential (primary) hypertension: Secondary | ICD-10-CM

## 2021-01-03 DIAGNOSIS — R600 Localized edema: Secondary | ICD-10-CM

## 2021-01-03 DIAGNOSIS — C61 Malignant neoplasm of prostate: Secondary | ICD-10-CM

## 2021-01-03 DIAGNOSIS — I2699 Other pulmonary embolism without acute cor pulmonale: Secondary | ICD-10-CM

## 2021-01-03 DIAGNOSIS — I5042 Chronic combined systolic (congestive) and diastolic (congestive) heart failure: Secondary | ICD-10-CM

## 2021-01-03 DIAGNOSIS — E785 Hyperlipidemia, unspecified: Secondary | ICD-10-CM

## 2021-01-03 DIAGNOSIS — E782 Mixed hyperlipidemia: Secondary | ICD-10-CM

## 2021-01-03 DIAGNOSIS — I471 Supraventricular tachycardia: Secondary | ICD-10-CM

## 2021-01-03 MED ORDER — PERFLUTREN LIPID MICROSPHERES 1.1 MG/ML IV SUSP
1-10 mL | Freq: Once | INTRAVENOUS | 0 refills | Status: CP | PRN
Start: 2021-01-03 — End: ?

## 2021-01-03 NOTE — Progress Notes
Recommendations from pulmonary physician at [facility] for consult on anticoagulation therapy.     Full consult sent to Medical records to be scanned into patient's chart.

## 2021-01-18 ENCOUNTER — Encounter: Admit: 2021-01-18 | Discharge: 2021-01-18 | Payer: BC Managed Care – PPO

## 2021-01-18 NOTE — Telephone Encounter
Discussed echo results with patient. Patient does not have any further questions at this time.

## 2021-01-18 NOTE — Telephone Encounter
-----   Message from Vertell Novak, MD sent at 01/17/2021  1:17 PM CDT -----  Please let this patient know that the echocardiogram showed normal heart function, the right side of the heart is also normal size and function.  He needs to continue all current medications.  I think he supposed to come back to the office in 6 months from the last office visit.    Thank you      ----- Message -----  From: Jeanette Caprice, MD  Sent: 01/05/2021   6:48 PM CDT  To: Vertell Novak, MD

## 2021-01-24 ENCOUNTER — Encounter: Admit: 2021-01-24 | Discharge: 2021-01-24 | Payer: BC Managed Care – PPO

## 2021-01-24 DIAGNOSIS — M48062 Spinal stenosis, lumbar region with neurogenic claudication: Secondary | ICD-10-CM

## 2021-01-24 DIAGNOSIS — M5416 Radiculopathy, lumbar region: Secondary | ICD-10-CM

## 2021-01-24 MED ORDER — PREGABALIN 50 MG PO CAP
ORAL_CAPSULE | Freq: Every day | 1 refills
Start: 2021-01-24 — End: ?

## 2021-11-10 ENCOUNTER — Encounter: Admit: 2021-11-10 | Discharge: 2021-11-10 | Payer: BC Managed Care – PPO

## 2021-11-10 MED ORDER — ATORVASTATIN 40 MG PO TAB
ORAL_TABLET | 2 refills | Status: AC
Start: 2021-11-10 — End: ?

## 2022-03-14 ENCOUNTER — Encounter: Admit: 2022-03-14 | Discharge: 2022-03-14 | Payer: BC Managed Care – PPO

## 2022-08-17 ENCOUNTER — Encounter: Admit: 2022-08-17 | Discharge: 2022-08-17 | Payer: BC Managed Care – PPO

## 2022-08-17 DIAGNOSIS — E785 Hyperlipidemia, unspecified: Secondary | ICD-10-CM

## 2022-08-17 DIAGNOSIS — I428 Other cardiomyopathies: Secondary | ICD-10-CM

## 2022-08-17 DIAGNOSIS — I10 Essential (primary) hypertension: Secondary | ICD-10-CM

## 2022-08-17 MED ORDER — ATORVASTATIN 40 MG PO TAB
40 mg | ORAL_TABLET | Freq: Every day | ORAL | 0 refills | Status: AC
Start: 2022-08-17 — End: ?

## 2022-08-22 ENCOUNTER — Encounter: Admit: 2022-08-22 | Discharge: 2022-08-22 | Payer: BC Managed Care – PPO

## 2022-09-01 ENCOUNTER — Encounter: Admit: 2022-09-01 | Discharge: 2022-09-01 | Payer: BC Managed Care – PPO

## 2022-09-01 MED ORDER — ATORVASTATIN 40 MG PO TAB
40 mg | ORAL_TABLET | Freq: Every day | ORAL | 3 refills | Status: AC
Start: 2022-09-01 — End: ?

## 2022-09-11 ENCOUNTER — Encounter: Admit: 2022-09-11 | Discharge: 2022-09-11 | Payer: BC Managed Care – PPO

## 2023-01-16 ENCOUNTER — Encounter: Admit: 2023-01-16 | Discharge: 2023-01-16 | Payer: BC Managed Care – PPO

## 2023-02-16 ENCOUNTER — Encounter: Admit: 2023-02-16 | Discharge: 2023-02-16 | Payer: BC Managed Care – PPO

## 2024-06-16 ENCOUNTER — Encounter: Admit: 2024-06-16 | Discharge: 2024-06-16 | Payer: BLUE CROSS/BLUE SHIELD
